# Patient Record
Sex: Male | Born: 1970
Health system: Southern US, Community
[De-identification: ages and names within clinical notes are randomized; demographics above are authoritative.]

## PROBLEM LIST (undated history)

## (undated) DIAGNOSIS — J189 Pneumonia, unspecified organism: Secondary | ICD-10-CM

## (undated) DIAGNOSIS — R011 Cardiac murmur, unspecified: Secondary | ICD-10-CM

## (undated) HISTORY — PX: KNEE ARTHROPLASTY: SHX992

## (undated) HISTORY — PX: KNEE ARTHROSCOPY: SHX127

---

## 2002-07-28 ENCOUNTER — Emergency Department (HOSPITAL_COMMUNITY): Admission: EM | Admit: 2002-07-28 | Discharge: 2002-07-28 | Payer: Self-pay | Admitting: Emergency Medicine

## 2003-09-27 ENCOUNTER — Observation Stay (HOSPITAL_COMMUNITY): Admission: RE | Admit: 2003-09-27 | Discharge: 2003-09-28 | Payer: Self-pay | Admitting: Orthopedic Surgery

## 2015-09-11 ENCOUNTER — Ambulatory Visit: Payer: Self-pay | Admitting: Internal Medicine

## 2016-08-22 DIAGNOSIS — Z0000 Encounter for general adult medical examination without abnormal findings: Secondary | ICD-10-CM | POA: Diagnosis not present

## 2016-08-22 DIAGNOSIS — Z125 Encounter for screening for malignant neoplasm of prostate: Secondary | ICD-10-CM | POA: Diagnosis not present

## 2016-08-29 DIAGNOSIS — N529 Male erectile dysfunction, unspecified: Secondary | ICD-10-CM | POA: Diagnosis not present

## 2016-08-29 DIAGNOSIS — I10 Essential (primary) hypertension: Secondary | ICD-10-CM | POA: Diagnosis not present

## 2016-08-29 DIAGNOSIS — R011 Cardiac murmur, unspecified: Secondary | ICD-10-CM | POA: Diagnosis not present

## 2016-08-29 DIAGNOSIS — R5383 Other fatigue: Secondary | ICD-10-CM | POA: Diagnosis not present

## 2016-08-29 DIAGNOSIS — Z0000 Encounter for general adult medical examination without abnormal findings: Secondary | ICD-10-CM | POA: Diagnosis not present

## 2016-10-16 DIAGNOSIS — N5201 Erectile dysfunction due to arterial insufficiency: Secondary | ICD-10-CM | POA: Diagnosis not present

## 2016-10-16 DIAGNOSIS — R3121 Asymptomatic microscopic hematuria: Secondary | ICD-10-CM | POA: Diagnosis not present

## 2017-02-14 DIAGNOSIS — S29011A Strain of muscle and tendon of front wall of thorax, initial encounter: Secondary | ICD-10-CM | POA: Diagnosis not present

## 2017-05-10 ENCOUNTER — Other Ambulatory Visit: Payer: Self-pay

## 2017-05-10 ENCOUNTER — Emergency Department (HOSPITAL_BASED_OUTPATIENT_CLINIC_OR_DEPARTMENT_OTHER)
Admission: EM | Admit: 2017-05-10 | Discharge: 2017-05-10 | Disposition: A | Discharge status: Home / Self Care | Payer: BLUE CROSS/BLUE SHIELD | Attending: Emergency Medicine | Admitting: Emergency Medicine

## 2017-05-10 ENCOUNTER — Encounter (HOSPITAL_BASED_OUTPATIENT_CLINIC_OR_DEPARTMENT_OTHER): Payer: Self-pay

## 2017-05-10 ENCOUNTER — Emergency Department (HOSPITAL_BASED_OUTPATIENT_CLINIC_OR_DEPARTMENT_OTHER): Payer: BLUE CROSS/BLUE SHIELD

## 2017-05-10 DIAGNOSIS — J189 Pneumonia, unspecified organism: Secondary | ICD-10-CM

## 2017-05-10 DIAGNOSIS — R0602 Shortness of breath: Secondary | ICD-10-CM | POA: Diagnosis not present

## 2017-05-10 DIAGNOSIS — R0789 Other chest pain: Secondary | ICD-10-CM | POA: Insufficient documentation

## 2017-05-10 DIAGNOSIS — R05 Cough: Secondary | ICD-10-CM | POA: Diagnosis not present

## 2017-05-10 DIAGNOSIS — J181 Lobar pneumonia, unspecified organism: Secondary | ICD-10-CM | POA: Insufficient documentation

## 2017-05-10 MED ORDER — ALBUTEROL SULFATE HFA 108 (90 BASE) MCG/ACT IN AERS
2.0000 | INHALATION_SPRAY | RESPIRATORY_TRACT | Status: DC | PRN
  Administered 2017-05-10: 2 via RESPIRATORY_TRACT
  Filled 2017-05-10: qty 6.7

## 2017-05-10 MED ORDER — DOXYCYCLINE HYCLATE 100 MG PO TABS
100.0000 mg | ORAL_TABLET | Freq: Once | ORAL | Status: AC
  Administered 2017-05-10: 100 mg via ORAL
  Filled 2017-05-10: qty 1

## 2017-05-10 MED ORDER — DOXYCYCLINE HYCLATE 100 MG PO CAPS
100.0000 mg | ORAL_CAPSULE | Freq: Two times a day (BID) | ORAL | 0 refills | Status: DC
Start: 2017-05-10 — End: 2017-07-22

## 2017-05-10 NOTE — ED Notes (Signed)
ED Provider at bedside. 

## 2017-05-10 NOTE — ED Provider Notes (Addendum)
[facility] Provider Note: Greg DellJ. Lane Garris Melhorn, MD, FACEP  CSN: [id number] MRN: [id number] ARRIVAL: 05/10/17 at 0007 ROOM: [facility]03/[facility]03   CHIEF COMPLAINT  Cough   HISTORY OF PRESENT ILLNESS  05/10/17 4:57 AM Greg Boone is a 46 y.o. male with a 2-1/2-week history of respiratory symptoms.  Specifically he has had a dry cough and lung congestion with occasional shortness of breath.  He has been taking over-the-counter cough and cold medications with some relief.  His symptoms are worse when at work; he works in a tobacco factory.  His symptoms worsened acutely yesterday evening when he was at a ball game.  He became more short of breath than usual and he developed mild to moderate pain in his right mid chest and epigastrium, pleuritic in nature.  He is not aware of having a fever but was noted to have a temperature of 99.5 degrees on arrival.  He has had night sweats.   History reviewed. No pertinent past medical history.  History reviewed. No pertinent surgical history.  No family history on file.  Social History   Tobacco Use  . Smoking status: Never Smoker  . Smokeless tobacco: Never Used  Substance Use Topics  . Alcohol use: Yes    Frequency: Never  . Drug use: No    Prior to Admission medications   Not on File    Allergies Patient has no known allergies.   REVIEW OF SYSTEMS  Negative except as noted here or in the History of Present Illness.   PHYSICAL EXAMINATION  Initial Vital Signs Blood pressure 127/65, pulse 70, temperature 99.5 F (37.5 C), temperature source Oral, resp. rate 18, height 6' (1.829 m), weight 74.8 kg (165 lb), SpO2 98 %.  Examination General: Well-developed, well-nourished male in no acute distress; appearance consistent with age of record HENT: normocephalic; atraumatic Eyes: pupils equal, round and reactive to light; extraocular muscles intact Neck: supple Heart: regular rate and rhythm Lungs: Decreased sounds right upper  lobe Abdomen: soft; nondistended; nontender; bowel sounds present Extremities: No deformity; full range of motion; pulses normal Neurologic: Awake, alert and oriented; motor function intact in all extremities and symmetric; no facial droop Skin: Warm and dry Psychiatric: Normal mood and affect   RESULTS  Summary of this visit's results, reviewed by myself:   EKG Interpretation  Date/Time:    Ventricular Rate:    PR Interval:    QRS Duration:   QT Interval:    QTC Calculation:   R Axis:     Text Interpretation:        Laboratory Studies: No results found for this or any previous visit (from the past 24 hour(s)). Imaging Studies: Dg Chest 2 View  Result Date: 05/10/2017 CLINICAL DATA:  Cough and congestion for 2 weeks.  Fever. EXAM: CHEST  2 VIEW COMPARISON:  None. FINDINGS: Small to moderate size consolidation in the right middle lobe. Minimal left basilar scarring. Normal heart size and mediastinal contours. No pulmonary edema, pleural fluid or pneumothorax. No acute osseous abnormalities are seen. IMPRESSION: Right middle lobe consolidation concerning for pneumonia in the setting of cough and fever. Followup PA and lateral chest X-ray is recommended in 3-4 weeks following trial of antibiotic therapy to ensure resolution and exclude underlying malignancy. Electronically Signed   By: Rubye OaksMelanie  Ehinger M.D.   On: 05/10/2017 04:25    ED COURSE  Nursing notes and initial vitals signs, including pulse oximetry, reviewed.  Vitals:   05/10/17 0028 05/10/17 [id number] 05/10/17 [id number]  BP: 127/65    Pulse: 70    Resp: 18  17  Temp: 99.5 F (37.5 C)  98.6 F (37 C)  TempSrc: Oral  Oral  SpO2: 98%  97%  Weight:  74.8 kg (165 lb)   Height:  6' (1.829 m)    Patient advised to follow-up with his PCP in several weeks to ensure resolution of x-ray abnormalities.  PROCEDURES    ED DIAGNOSES     ICD-10-CM   1. Community acquired pneumonia of right middle lobe of lung (HCC) J18.1         Aleese Kamps, MD 05/10/17 [id number]    Paula LibraMolpus, Justin Meisenheimer, MD 05/10/17 [phone]

## 2017-05-10 NOTE — ED Triage Notes (Signed)
Pt c/o cough and congestion x2 1/2 wks;

## 2017-05-26 DIAGNOSIS — J181 Lobar pneumonia, unspecified organism: Secondary | ICD-10-CM | POA: Diagnosis not present

## 2017-06-06 DIAGNOSIS — J181 Lobar pneumonia, unspecified organism: Secondary | ICD-10-CM | POA: Diagnosis not present

## 2017-06-25 DIAGNOSIS — J181 Lobar pneumonia, unspecified organism: Secondary | ICD-10-CM | POA: Diagnosis not present

## 2017-06-25 DIAGNOSIS — J189 Pneumonia, unspecified organism: Secondary | ICD-10-CM | POA: Diagnosis not present

## 2017-06-26 ENCOUNTER — Other Ambulatory Visit: Payer: Self-pay | Admitting: Internal Medicine

## 2017-06-26 DIAGNOSIS — J690 Pneumonitis due to inhalation of food and vomit: Secondary | ICD-10-CM

## 2017-06-27 ENCOUNTER — Other Ambulatory Visit: Payer: Self-pay | Admitting: Internal Medicine

## 2017-06-27 DIAGNOSIS — R9389 Abnormal findings on diagnostic imaging of other specified body structures: Secondary | ICD-10-CM

## 2017-06-27 DIAGNOSIS — J984 Other disorders of lung: Secondary | ICD-10-CM | POA: Diagnosis not present

## 2017-06-27 DIAGNOSIS — J690 Pneumonitis due to inhalation of food and vomit: Secondary | ICD-10-CM

## 2017-07-02 ENCOUNTER — Other Ambulatory Visit: Payer: BLUE CROSS/BLUE SHIELD

## 2017-07-02 ENCOUNTER — Ambulatory Visit
Admission: RE | Admit: 2017-07-02 | Discharge: 2017-07-02 | Disposition: A | Discharge status: Home / Self Care | Payer: BLUE CROSS/BLUE SHIELD | Source: Ambulatory Visit | Attending: Internal Medicine | Admitting: Internal Medicine

## 2017-07-02 DIAGNOSIS — J984 Other disorders of lung: Secondary | ICD-10-CM

## 2017-07-02 DIAGNOSIS — R9389 Abnormal findings on diagnostic imaging of other specified body structures: Secondary | ICD-10-CM

## 2017-07-02 DIAGNOSIS — J690 Pneumonitis due to inhalation of food and vomit: Secondary | ICD-10-CM

## 2017-07-02 DIAGNOSIS — J9811 Atelectasis: Secondary | ICD-10-CM | POA: Diagnosis not present

## 2017-07-02 MED ORDER — IOPAMIDOL (ISOVUE-300) INJECTION 61%
75.0000 mL | Freq: Once | INTRAVENOUS | Status: AC | PRN
Start: 2017-07-02 — End: 2017-07-02
  Administered 2017-07-02: 75 mL via INTRAVENOUS

## 2017-07-22 ENCOUNTER — Encounter: Payer: Self-pay | Admitting: Internal Medicine

## 2017-07-22 ENCOUNTER — Ambulatory Visit (INDEPENDENT_AMBULATORY_CARE_PROVIDER_SITE_OTHER): Payer: BLUE CROSS/BLUE SHIELD | Admitting: Internal Medicine

## 2017-07-22 VITALS — BP 130/80 | HR 58 | Ht 72.0 in | Wt 170.2 lb

## 2017-07-22 DIAGNOSIS — R058 Other specified cough: Secondary | ICD-10-CM

## 2017-07-22 DIAGNOSIS — J181 Lobar pneumonia, unspecified organism: Secondary | ICD-10-CM | POA: Diagnosis not present

## 2017-07-22 DIAGNOSIS — J189 Pneumonia, unspecified organism: Secondary | ICD-10-CM

## 2017-07-22 DIAGNOSIS — R05 Cough: Secondary | ICD-10-CM | POA: Diagnosis not present

## 2017-07-22 DIAGNOSIS — J9819 Other pulmonary collapse: Secondary | ICD-10-CM | POA: Diagnosis not present

## 2017-07-22 NOTE — Patient Instructions (Addendum)
ICD-10-CM   1. Pneumonia of right middle lobe due to infectious organism (HCC) J18.1   2. 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 Nocturnal cough R05     Take OTC prilosec 20mg  daily on empty stomach and see if night cough resolves Do followup CT chest without contrast mid to  end of may 2019   Followup - return to see me after CT chest in may 2019 - return earlier if needed especially if symptomatic

## 2017-07-22 NOTE — Progress Notes (Signed)
Subjective:    Patient ID: Greg Boone, male    DOB: 07/18/1970, 47 y.o.   MRN: [id number]  PCP System, Pcp Not In  HPI  IOV 07/22/2017  Chief Complaint  Patient presents with  . Advice Only    Referred by Dr. Renne CriglerPharr due to pulm consult for rt middle lobe.  Pt was told he had pna December 2018. Pt denies any current complaints of cough, SOB, or CP.   47 year old male without any medical problems.  He reports that in the last 2 weeks of November 2018 he started having some cough and shortness of breath.  At that point in time he was taking his son to a lot of football games in the open field with a lot of rain.  On May 10, 2017 he ended up in the ER because of symptoms.  Chest x-ray on that particular day that I personally visualized showed significant right middle lobe consolidation.  He was discharged with albuterol inhaler as needed and also 1 week of doxycycline.  After that he bounced back to normal.  Only symptom right now is that his girlfriend reports that he occasionally has nocturnal cough that he is unaware of.  It is mild.  It is not progressive.  Overall he is back to baseline and is fully functional.  He would not know that he has a health issue.  There is no fever or chills or hemoptysis orthopnea or paroxysmal nocturnal dyspnea or hemoptysis or weight loss or anorexia.  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  He did have a follow-up chest x-ray in January with primary care physician and most recently a CT scan of the chest July 02, 2017 that shows right middle lobe atelectasis scarring pattern.     has no past medical history on file.   reports that  has never smoked. he has never used smokeless tobacco.  No past surgical history on file.  No Known Allergies  Immunization History  Administered Date(s) Administered  . Influenza Split 03/24/2017     No family history on file.  No current outpatient medications on file.    Review of Systems  Constitutional: Negative for fever and unexpected weight change.  HENT: Negative for congestion, dental problem, ear pain, nosebleeds, postnasal drip, rhinorrhea, sinus pressure, sneezing, sore throat and trouble swallowing.   Eyes: Negative for redness and itching.  Respiratory: Negative for cough, chest tightness, shortness of breath and wheezing.   Cardiovascular: Negative for palpitations and leg swelling.  Gastrointestinal: Negative for nausea and vomiting.  Genitourinary: Negative for dysuria.  Musculoskeletal: Negative for joint swelling.  Skin: Negative for rash.  Allergic/Immunologic: Negative.  Negative for environmental allergies, food allergies and immunocompromised state.  Neurological: Negative for headaches.  Hematological: Does not bruise/bleed easily.  Psychiatric/Behavioral: Negative for dysphoric mood. The patient is not nervous/anxious.        Objective:   Physical Exam  Constitutional: He is oriented to person, place, and time. He appears well-developed and well-nourished. No distress.  HENT:  Head: Normocephalic and atraumatic.  Right Ear: External ear normal.  Left Ear: External ear normal.  Mouth/Throat: Oropharynx is clear and moist. No oropharyngeal exudate.  Eyes: Conjunctivae and EOM are normal. Pupils are equal, round, and reactive to light. Right eye exhibits no discharge. Left eye exhibits no discharge. No scleral icterus.  Neck: Normal range of motion. Neck supple. No JVD present. No tracheal deviation present. No thyromegaly present.  Cardiovascular: Normal rate, regular rhythm and intact distal pulses. Exam reveals no gallop and no friction rub.  No murmur heard. Pulmonary/Chest: Effort normal and breath sounds normal. No respiratory distress. He has no wheezes. He has no rales. He exhibits no tenderness.  Abdominal: Soft. Bowel sounds are normal. He  exhibits no distension and no mass. There is no tenderness. There is no rebound and no guarding.  Musculoskeletal: Normal range of motion. He exhibits no edema or tenderness.  Lymphadenopathy:    He has no cervical adenopathy.  Neurological: He is alert and oriented to person, place, and time. He has normal reflexes. No cranial nerve deficit. Coordination normal.  Skin: Skin is warm and dry. No rash noted. He is not diaphoretic. No erythema. No pallor.  Psychiatric: He has a normal mood and affect. His behavior is normal. Judgment and thought content normal.  Nursing note and vitals reviewed.   Vitals:   07/22/17 0937  BP: 130/80  Pulse: (!) 58  SpO2: 99%  Weight: 170 lb 3.2 oz (77.2 kg)  Height: 6' (1.829 m)    Estimated body mass index is 23.08 kg/m as calculated from the following:   Height as of this encounter: 6' (1.829 m).   Weight as of this encounter: 170 lb 3.2 oz (77.2 kg).       Assessment & Plan:     ICD-10-CM   1. Pneumonia of right middle lobe due to infectious organism (HCC) J18.1   2. 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 Nocturnal cough R05    Clinically resolving pneumonia versus developing right middle lobe syndrome.  Currently asymptomatic.  Nocturnal cough is probably reflective of acid reflux but it could also be the residual atelectasis.  At this point in time because he is feeling well expectant approach with radiologic follow-up is the best approach.  He is agreeable with the plan and therefore   Take OTC prilosec 20mg  daily on empty stomach and see if night cough resolves Do followup CT chest without contrast mid to  end of may 2019   Followup - return to see me after CT chest in may 2019 - return earlier if needed especially if symptomatic    Dr. Kalman Shan, M.D., [facility].C.P Pulmonary and Critical Care Medicine Staff Physician, [facility] Center Director - Interstitial Lung Disease  Program  [facility] at [facility] [place], [place], [address]  Pager: [phone], If no answer or between  15:00h - 7:00h: call [phone] Telephone: [phone]

## 2017-07-31 ENCOUNTER — Institutional Professional Consult (permissible substitution): Payer: BLUE CROSS/BLUE SHIELD | Admitting: Internal Medicine

## 2017-08-25 DIAGNOSIS — N390 Urinary tract infection, site not specified: Secondary | ICD-10-CM | POA: Diagnosis not present

## 2017-08-25 DIAGNOSIS — I10 Essential (primary) hypertension: Secondary | ICD-10-CM | POA: Diagnosis not present

## 2017-08-25 DIAGNOSIS — Z0000 Encounter for general adult medical examination without abnormal findings: Secondary | ICD-10-CM | POA: Diagnosis not present

## 2017-08-25 DIAGNOSIS — R319 Hematuria, unspecified: Secondary | ICD-10-CM | POA: Diagnosis not present

## 2017-09-01 DIAGNOSIS — I10 Essential (primary) hypertension: Secondary | ICD-10-CM | POA: Diagnosis not present

## 2017-09-01 DIAGNOSIS — N529 Male erectile dysfunction, unspecified: Secondary | ICD-10-CM | POA: Diagnosis not present

## 2017-09-01 DIAGNOSIS — R011 Cardiac murmur, unspecified: Secondary | ICD-10-CM | POA: Diagnosis not present

## 2017-09-01 DIAGNOSIS — Z0000 Encounter for general adult medical examination without abnormal findings: Secondary | ICD-10-CM | POA: Diagnosis not present

## 2017-10-15 ENCOUNTER — Ambulatory Visit (INDEPENDENT_AMBULATORY_CARE_PROVIDER_SITE_OTHER)
Admission: RE | Admit: 2017-10-15 | Discharge: 2017-10-15 | Disposition: A | Discharge status: Home / Self Care | Payer: BLUE CROSS/BLUE SHIELD | Source: Ambulatory Visit | Attending: Internal Medicine | Admitting: Internal Medicine

## 2017-10-15 DIAGNOSIS — J181 Lobar pneumonia, unspecified organism: Secondary | ICD-10-CM | POA: Diagnosis not present

## 2017-10-15 DIAGNOSIS — J189 Pneumonia, unspecified organism: Secondary | ICD-10-CM

## 2017-10-16 ENCOUNTER — Ambulatory Visit (INDEPENDENT_AMBULATORY_CARE_PROVIDER_SITE_OTHER): Payer: BLUE CROSS/BLUE SHIELD | Admitting: Internal Medicine

## 2017-10-16 ENCOUNTER — Encounter: Payer: Self-pay | Admitting: Internal Medicine

## 2017-10-16 VITALS — BP 132/74 | HR 50 | Ht 72.0 in | Wt 169.2 lb

## 2017-10-16 DIAGNOSIS — J181 Lobar pneumonia, unspecified organism: Secondary | ICD-10-CM

## 2017-10-16 DIAGNOSIS — J189 Pneumonia, unspecified organism: Secondary | ICD-10-CM

## 2017-10-16 NOTE — Patient Instructions (Addendum)
ICD-10-CM   1. Pneumonia of right middle lobe due to infectious organism [facility]) J18.1     On CT 10/15/17 this looks like persistent scar with some lung tissue local destructiin (called mild bronchiectasis)  Given fact your asymptomatic we can watch this  Followup  CXR 2 view in 3 months - t his is CXR only visit Return in 6 months or sooenr if needed - if symptomatic at any time return sooner

## 2017-10-16 NOTE — Progress Notes (Signed)
Subjective:     Patient ID: Greg Boone, male   DOB: Feb 19, 1971, 47 y.o.   MRN: [id number]  HPI   PCP System, Pcp Not In  HPI  IOV 07/22/2017  Chief Complaint  Patient presents with  . Advice Only    Referred by Dr. Renne Crigler due to pulm consult for rt middle lobe.  Pt was told he had pna December 2018. Pt denies any current complaints of cough, SOB, or CP.   47 year old male without any medical problems.  He reports that in the last 2 weeks of November 2018 he started having some cough and shortness of breath.  At that point in time he was taking his son to a lot of football games in the open field with a lot of rain.  On May 10, 2017 he ended up in the ER because of symptoms.  Chest x-ray on that particular day that I personally visualized showed significant right middle lobe consolidation.  He was discharged with albuterol inhaler as needed and also 1 week of doxycycline.  After that he bounced back to normal.  Only symptom right now is that his girlfriend reports that he occasionally has nocturnal cough that he is unaware of.  It is mild.  It is not progressive.  Overall he is back to baseline and is fully functional.  He would not know that he has a health issue.  There is no fever or chills or hemoptysis orthopnea or paroxysmal nocturnal dyspnea or hemoptysis or weight loss or anorexia.  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  He did have a follow-up chest x-ray in January with primary care physician and most recently a CT scan of the chest July 02, 2017 that shows right middle lobe atelectasis scarring pattern.   OV 10/16/2017  Chief Complaint  Patient presents with  . Follow-up    HRCT done 3/27. Pt states he has been doing good since last visit and denies any complaints or concerns.     Greg Boone returns for follow-up of his right middle lobe posterior  pneumonia focal scar with bronchiectasis local  In the interim he continues to be asymptomatic.  He had CT scan of the chest yesterday that I personally visualized and it shows persistent scar with focal bronchiectasis.  He denies any shortness of breath or cough or wheezing or hemoptysis or sputum production orthopnea wheezing or weight loss or night sweats or chills.  Ct Chest Wo Contrast  Result Date: 10/15/2017 CLINICAL DATA:  47 year old male with history of right middle lobe pneumonia. Follow-up study. EXAM: CT CHEST WITHOUT CONTRAST TECHNIQUE: Multidetector CT imaging of the chest was performed following the standard protocol without IV contrast. COMPARISON:  Chest CT 07/02/2017. FINDINGS: Cardiovascular: Heart size is normal. There is no significant pericardial fluid, thickening or pericardial calcification. No significant atherosclerotic calcifications noted in the thoracic aorta or the coronary arteries. Mediastinum/Nodes: No pathologically enlarged mediastinal or hilar lymph nodes. Please note that accurate exclusion of hilar adenopathy is limited on noncontrast CT scans. Esophagus is unremarkable in appearance. No axillary lymphadenopathy. Lungs/Pleura: Chronic scarring and volume loss in the right middle lobe predominantly involving the lateral segment. Mild scarring also noted in the left lower lobe at the lung base. No acute consolidative airspace disease. No pleural effusions. No suspicious appearing pulmonary nodules or masses. Upper Abdomen: 7 mm  low-attenuation lesion in segment 8 of the liver, incompletely characterized on today's noncontrast CT examination, but similar to the prior study, likely a tiny cyst. Musculoskeletal: There are no aggressive appearing lytic or blastic lesions noted in the visualized portions of the skeleton. IMPRESSION: 1. Chronic scarring in the lateral segment of the right middle lobe is unchanged compared to the prior study, presumably related to remote right  middle lobe pneumonia. 2. Additional incidental findings, as above. Electronically Signed   By: Trudie Reed M.D.   On: 10/15/2017 13:35        has no past medical history on file.   reports that he has never smoked. He has never used smokeless tobacco.  No past surgical history on file.  No Known Allergies  Immunization History  Administered Date(s) Administered  . Influenza Split 03/24/2017    No family history on file.  No current outpatient medications on file.   Review of Systems     Objective:   Physical Exam Vitals:   10/16/17 0934  BP: 132/74  Pulse: (!) 50  SpO2: 100%  Weight: 169 lb 3.2 oz (76.7 kg)  Height: 6' (1.829 m)   Discussion only visit    Assessment:       ICD-10-CM   1. Pneumonia of right middle lobe due to infectious organism [facility]) J18.1 DG Chest 2 View       Plan:      On CT 10/15/17 this looks like persistent scar with some lung tissue local destructiin (called mild bronchiectasis)  Given fact your asymptomatic we can watch this  Followup  CXR 2 view in 3 months - t his is CXR only visit Return in 6 months or sooenr if needed - if symptomatic at any time return sooner  (> 50% of this 15 min visit spent in face to face counseling or/and coordination of care)   Dr. Kalman Shan, M.D., [facility].C.P Pulmonary and Critical Care Medicine Staff Physician, [facility] Center Director - Interstitial Lung Disease  Program  Pulmonary Fibrosis [facility] at [facility] [place], [place], [address]  Pager: [phone], If no answer or between  15:00h - 7:00h: call [phone] Telephone: [phone]

## 2017-12-26 DIAGNOSIS — H40013 Open angle with borderline findings, low risk, bilateral: Secondary | ICD-10-CM | POA: Diagnosis not present

## 2018-08-27 DIAGNOSIS — R5383 Other fatigue: Secondary | ICD-10-CM | POA: Diagnosis not present

## 2018-08-27 DIAGNOSIS — Z0000 Encounter for general adult medical examination without abnormal findings: Secondary | ICD-10-CM | POA: Diagnosis not present

## 2018-08-27 DIAGNOSIS — I10 Essential (primary) hypertension: Secondary | ICD-10-CM | POA: Diagnosis not present

## 2018-08-27 DIAGNOSIS — E559 Vitamin D deficiency, unspecified: Secondary | ICD-10-CM | POA: Diagnosis not present

## 2018-08-27 DIAGNOSIS — Z125 Encounter for screening for malignant neoplasm of prostate: Secondary | ICD-10-CM | POA: Diagnosis not present

## 2018-09-03 DIAGNOSIS — Z0000 Encounter for general adult medical examination without abnormal findings: Secondary | ICD-10-CM | POA: Diagnosis not present

## 2018-11-29 IMAGING — CT CT CHEST W/ CM
1 of 2 series · 14 of 30 positions shown, 18 images · IV contrast (APPLIED)
Comparison: Chest radiograph from 05/10/2017.

CLINICAL DATA: Followup abnormal chest radiograph. Evaluate for
scarring.

EXAM:
CT CHEST WITH CONTRAST
TECHNIQUE: Multidetector CT imaging of the chest was performed during
intravenous contrast administration.
CONTRAST:  75mL [id number] IOPAMIDOL (0GBRHF-6VV) INJECTION 61%

[Series 2: chest w/cm · axial · 0.66mm/px · z∈[+930,+1264]mm · 14 of 195 slices shown, 18 images]
[im 14/195  mediastinal]
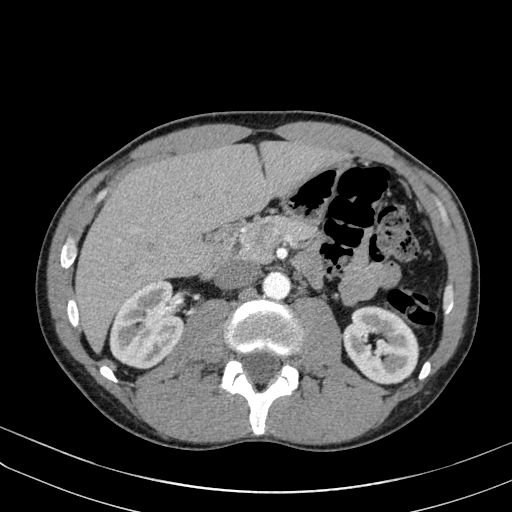
[im 14/195  lung]
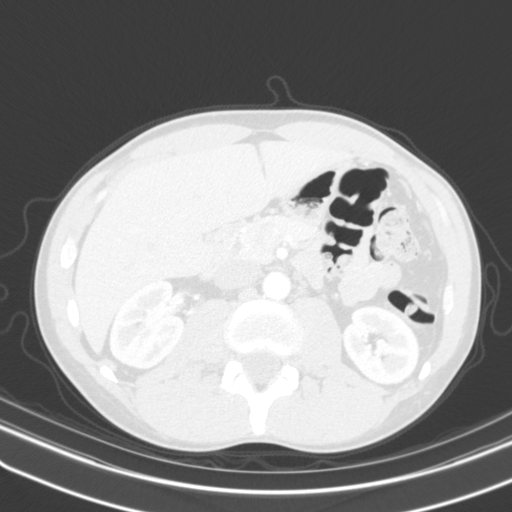
[im 28/195  lung]
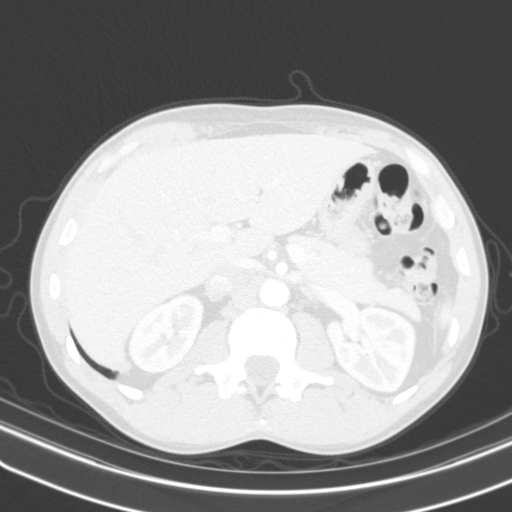
[im 42/195  lung]
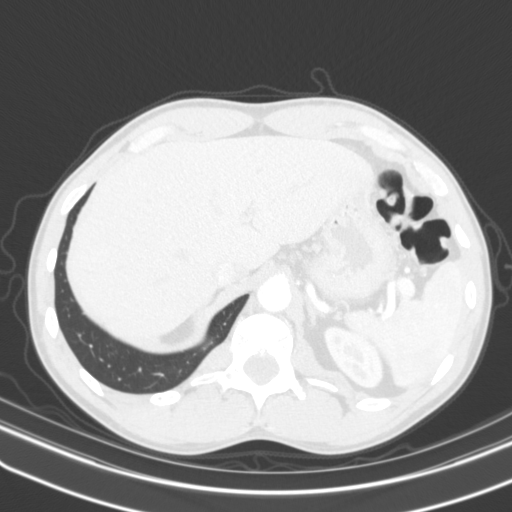
[im 56/195  lung]
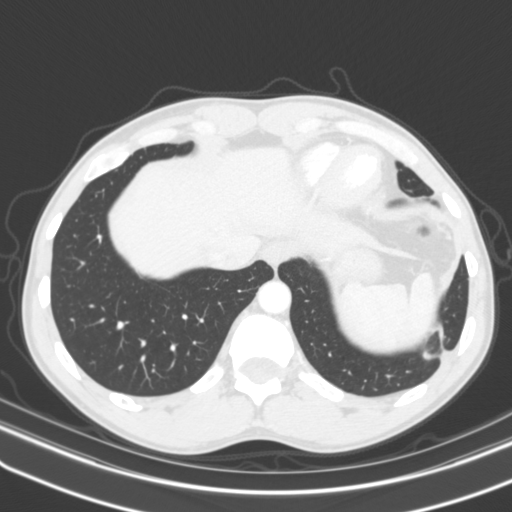
[im 70/195  mediastinal]
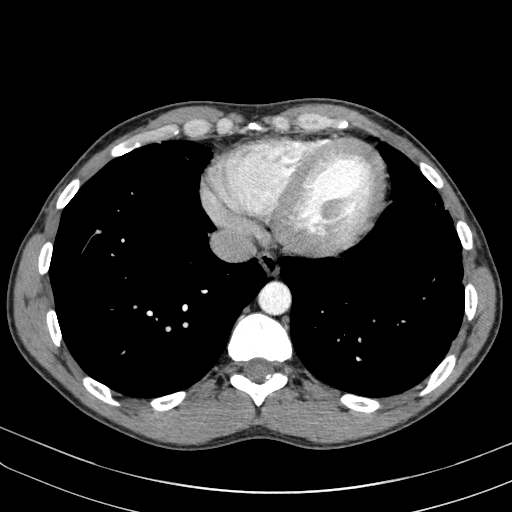
[im 70/195  lung]
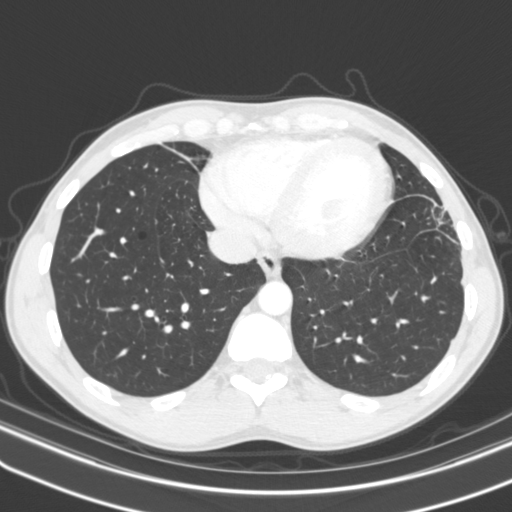
[im 84/195  lung]
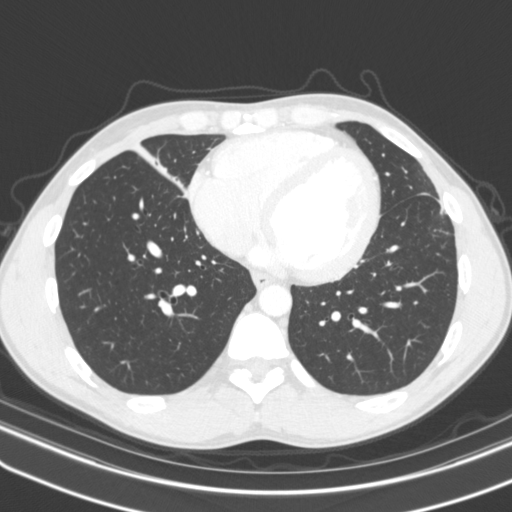
[im 93/195  lung]
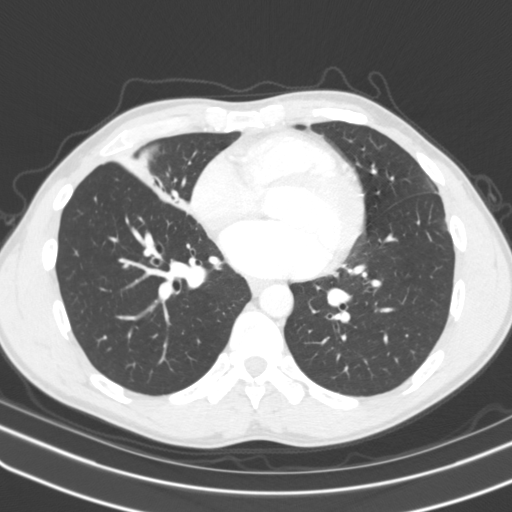
[im 98/195  lung]
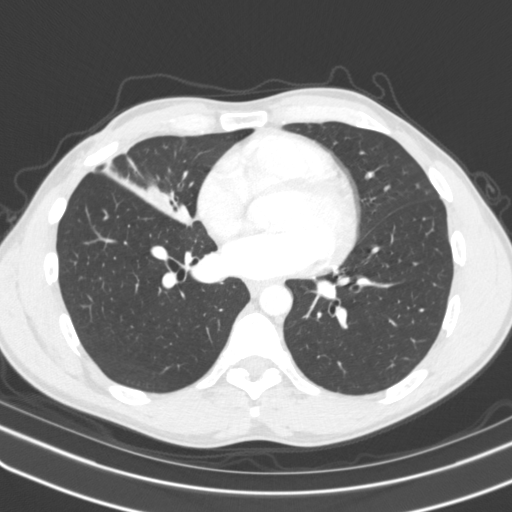
[im 111/195  mediastinal]
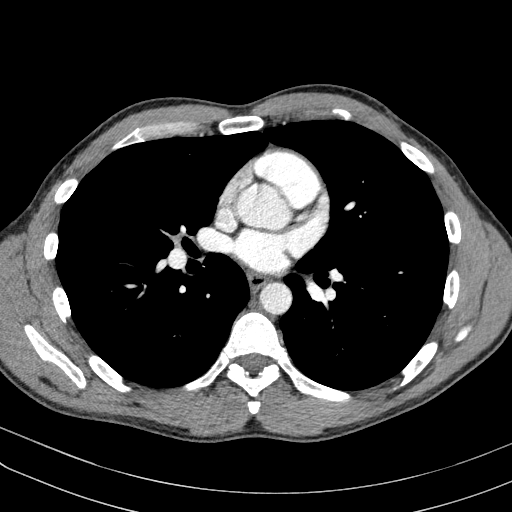
[im 111/195  lung]
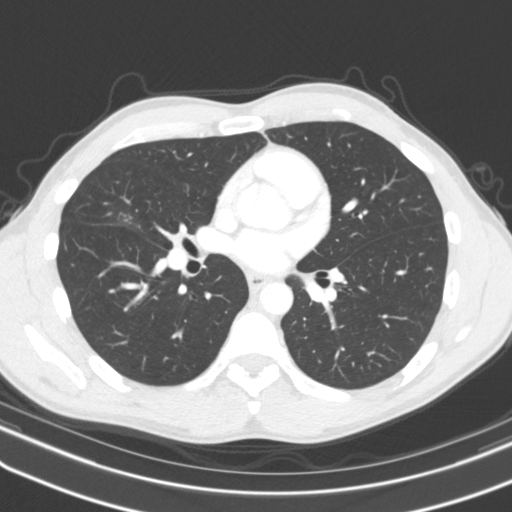
[im 125/195  lung]
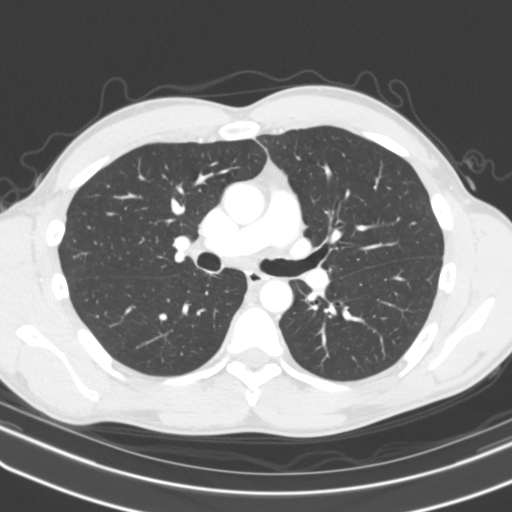
[im 139/195  lung]
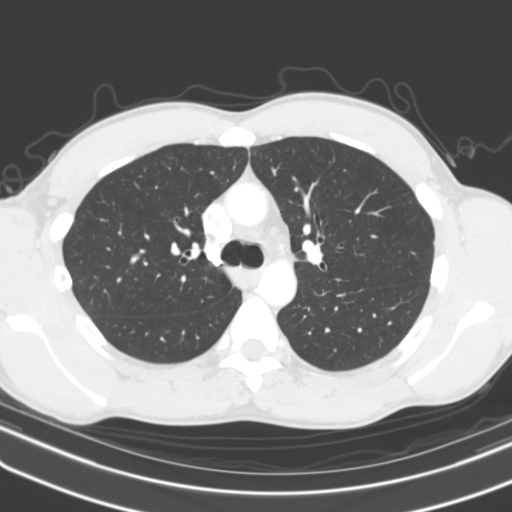
[im 153/195  lung]
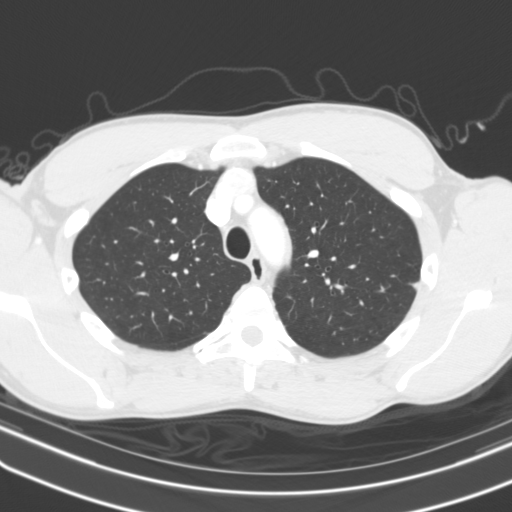
[im 167/195  mediastinal]
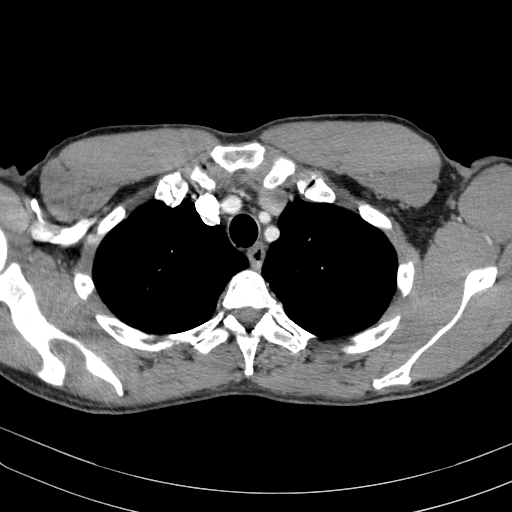
[im 167/195  lung]
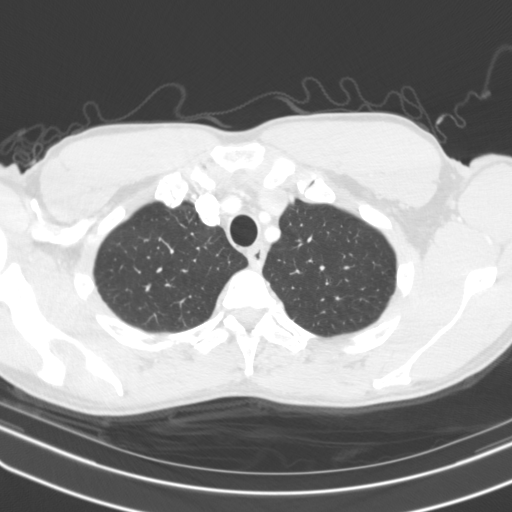
[im 181/195  lung]
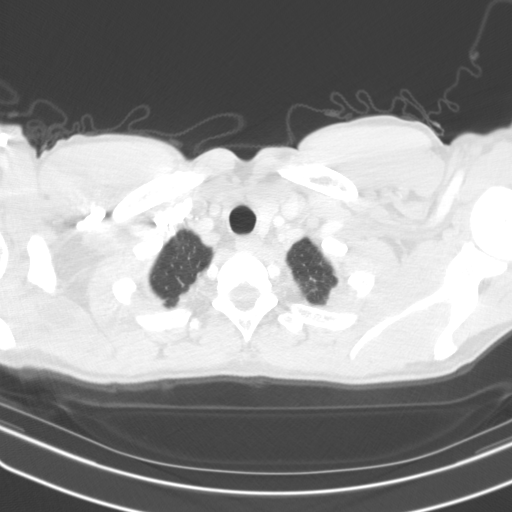

[14 of 30 positions shown; findings below may reference images not displayed]

FINDINGS: Cardiovascular: Normal heart size.  No pericardial effusion.

Mediastinum/Nodes: The trachea appears patent and is midline. Normal
appearance of the esophagus. No supraclavicular or axillary
adenopathy. No enlarged mediastinal or hilar lymph nodes identified.

Lungs/Pleura: No pleural effusion. Scarring, volume loss and
subsegmental atelectasis involving the right middle lobe is
identified, likely the sequelae of previous pneumonia. No
endobronchial lesion identified. No suspicious pulmonary nodules or
masses identified.

Upper Abdomen: No acute abnormality. Tiny cyst noted within the
anterolateral 6 mm. Adjacent low-attenuation structure is too small
to characterize, image number 159 of series 2.

Musculoskeletal: No chest wall abnormality. No acute or significant
osseous findings.
IMPRESSION: 1. Scarring, volume loss and/or subsegmental atelectasis in area of
previous pneumonia noted in the right middle lobe. Suggest followup
PA and lateral chest radiograph in 3 months to ensure stability
and/or resolution.

## 2019-02-22 DIAGNOSIS — H40023 Open angle with borderline findings, high risk, bilateral: Secondary | ICD-10-CM | POA: Diagnosis not present

## 2019-06-09 ENCOUNTER — Ambulatory Visit: Payer: BC Managed Care – PPO | Attending: Internal Medicine

## 2019-06-09 DIAGNOSIS — Z20828 Contact with and (suspected) exposure to other viral communicable diseases: Secondary | ICD-10-CM | POA: Diagnosis not present

## 2019-06-09 DIAGNOSIS — Z20822 Contact with and (suspected) exposure to covid-19: Secondary | ICD-10-CM

## 2019-06-10 LAB — NOVEL CORONAVIRUS, NAA: SARS-CoV-2, NAA: NOT DETECTED

## 2019-08-27 DIAGNOSIS — Z125 Encounter for screening for malignant neoplasm of prostate: Secondary | ICD-10-CM | POA: Diagnosis not present

## 2019-08-27 DIAGNOSIS — I10 Essential (primary) hypertension: Secondary | ICD-10-CM | POA: Diagnosis not present

## 2019-08-27 DIAGNOSIS — Z0000 Encounter for general adult medical examination without abnormal findings: Secondary | ICD-10-CM | POA: Diagnosis not present

## 2019-08-27 DIAGNOSIS — Z13220 Encounter for screening for lipoid disorders: Secondary | ICD-10-CM | POA: Diagnosis not present

## 2019-09-08 DIAGNOSIS — D509 Iron deficiency anemia, unspecified: Secondary | ICD-10-CM | POA: Diagnosis not present

## 2019-09-08 DIAGNOSIS — R011 Cardiac murmur, unspecified: Secondary | ICD-10-CM | POA: Diagnosis not present

## 2019-09-08 DIAGNOSIS — Z0000 Encounter for general adult medical examination without abnormal findings: Secondary | ICD-10-CM | POA: Diagnosis not present

## 2019-09-23 DIAGNOSIS — K829 Disease of gallbladder, unspecified: Secondary | ICD-10-CM | POA: Diagnosis not present

## 2019-09-30 ENCOUNTER — Ambulatory Visit: Payer: Self-pay | Admitting: General Surgery

## 2019-09-30 DIAGNOSIS — K4090 Unilateral inguinal hernia, without obstruction or gangrene, not specified as recurrent: Secondary | ICD-10-CM | POA: Diagnosis not present

## 2019-09-30 NOTE — H&P (Signed)
Greg Boone Documented: 09/30/2019 8:49 AM Location: [facility] Patient #: [id number] DOB: 08/04/1970 Single / Language: [place] / Race: Black or African American Male  History of Present Illness Greg Areola Boone. Daulton Harbaugh Boone; 09/30/2019 9:31 AM) The patient is a 49 year old male who presents with an inguinal hernia. He is referred by Dr. Selena Boone for evaluation of a right inguinal hernia. The patient states that he first noticed it around 1994 after playing basketball. He didn't really bother him with respect to causing any pain or discomfort. He was always able to reduce it himself. He wore a hernia belt for many years. However more recently he is having to reduce it more frequently. Toward the end of the work day it is causing more "irritation" and he is having to reduce it more frequently. He also states that after eating it'll generally pop out and he will feel it. He denies any burning, shooting or stabbing pain. He'll generally have less discomfort after sitting down. He denies any significant BPH symptoms. He denies any tobacco use. He denies any chest pain, chest pressure, source of breath, dyspnea on exertion, TIAs or amaurosis fugax. He denies any prior abdominal surgery.  I reviewed his PCP note from the end of March. He had a mildly borderline hemoglobin of 12 but a normal hematocrit. MCV was 74. T bili was 1.4 but otherwise his liver function test and comprehensive metabolic panel was normal.   Problem List/Past Medical Greg Areola Boone. Andrey Campanile, Boone; 09/30/2019 9:31 AM) RIGHT INGUINAL HERNIA (K40.90)  Allergies [facility], Boone; 09/30/2019 8:50 AM) No Known Drug Allergies [09/30/2019]: Allergies Reconciled  Medication History [facility], Boone; 09/30/2019 8:50 AM) Cialis (5MG  Tablet, Oral) Active. Multi Vitamin (Oral) Active. Vitamin C (Oral) Specific strength unknown - Active. Medications Reconciled  Social History [place], Boone; 09/30/2019  8:49 AM) No drug use  Other Problems 10/02/2019 Boone. Boone, Boone; 09/30/2019 9:31 AM) Heart murmur     Review of Systems 10/02/2019 Boone. Brayln Duque Boone; 09/30/2019 9:29 AM) 10/02/2019 Present- Wears glasses/contact lenses. Not Present- Earache, Hearing Loss, Hoarseness, Nose Bleed, Oral Ulcers, Ringing in the Ears, Seasonal Allergies, Sinus Pain, Sore Throat, Visual Disturbances and Yellow Eyes. Cardiovascular Not Present- Chest Pain, Difficulty Breathing Lying Down, Leg Cramps, Palpitations, Rapid Heart Rate, Shortness of Breath and Swelling of Extremities. Male Genitourinary Not Present- Blood in Urine, Change in Urinary Stream, Frequency, Impotence, Nocturia, Painful Urination, Urgency and Urine Leakage. Musculoskeletal Not Present- Back Pain, Joint Pain, Joint Stiffness, Muscle Pain, Muscle Weakness and Swelling of Extremities. All other systems negative  Vitals Greg Boone; 09/30/2019 8:50 AM) 09/30/2019 8:50 AM Weight: 169.2 lb Height: 72in Body Surface Area: 1.98 Boone Body Mass Index: 22.95 kg/Boone  Temp.: 98.80F(Temporal)  Pulse: 62 (Regular)  P.OX: 99% (Room air) BP: 140/70 (Sitting, Left Arm, Standard)        Physical Exam 10/02/2019 Greg Boone; 09/30/2019 9:29 AM)  General Mental Status-Alert. General Appearance-Consistent with stated age. Hydration-Well hydrated. Voice-Normal.  Head and Neck Head-normocephalic, atraumatic with no lesions or palpable masses. Trachea-midline. Thyroid Gland Characteristics - normal size and consistency.  Eye Eyeball - Bilateral-Extraocular movements intact. Sclera/Conjunctiva - Bilateral-No scleral icterus.  Chest and Lung Exam Chest and lung exam reveals -quiet, even and easy respiratory effort with no use of accessory muscles and on auscultation, normal breath sounds, no adventitious sounds and normal vocal resonance. Inspection Chest Wall - Normal. Back - normal.  Breast - Did not  examine.  Cardiovascular Cardiovascular examination reveals -normal  heart sounds, regular rate and rhythm with no murmurs and normal pedal pulses bilaterally.  Abdomen Inspection Inspection of the abdomen reveals - No Hernias. Skin - Scar - no surgical scars. Palpation/Percussion Palpation and Percussion of the abdomen reveal - Soft, Non Tender, No Rebound tenderness, No Rigidity (guarding) and No hepatosplenomegaly. Auscultation Auscultation of the abdomen reveals - Bowel sounds normal.  Male Genitourinary Note: Both testicles descended. No scrotal testicular masses. No bulge with Valsalva in the left inguinal canal. Obvious small bulge in right inguinal canal. Positive bulge with Valsalva in right inguinal canal. Reducible  Peripheral Vascular Upper Extremity Palpation - Pulses bilaterally normal.  Neurologic Neurologic evaluation reveals -alert and oriented x 3 with no impairment of recent or remote memory. Mental Status-Normal.  Neuropsychiatric The patient's mood and affect are described as -normal. Judgment and Insight-insight is appropriate concerning matters relevant to self.  Musculoskeletal Normal Exam - Left-Upper Extremity Strength Normal and Lower Extremity Strength Normal. Normal Exam - Right-Upper Extremity Strength Normal and Lower Extremity Strength Normal.  Lymphatic Head & Neck  General Head & Neck Lymphatics: Bilateral - Description - Normal. Axillary - Did not examine. Femoral & Inguinal - Did not examine.    Assessment & Plan Greg Hiss Boone. Makensey Rego Boone; 09/30/2019 9:28 AM)  RIGHT INGUINAL HERNIA (K40.90) Impression: He has a long-standing inguinal hernia that is now becoming more symptomatic so therefore I recommended operative repair.  We discussed the etiology of inguinal hernias. We discussed the signs & symptoms of incarceration & strangulation. We discussed non-operative and operative management. We discussed minimally  invasive/laparoscopic approach  The patient has elected to proceed with Laparoscopic repair of right inguinal hernia with mesh   I described the procedure in detail. The patient was given educational material. We discussed the risks and benefits including but not limited to bleeding, infection, chronic inguinal pain, nerve entrapment, hernia recurrence, mesh complications, hematoma formation, urinary retention, injury to the testicle, numbness in the groin, blood clots, injury to the surrounding structures, and anesthesia risk. We also discussed the typical post operative recovery course, including no heavy lifting for 4-6 weeks. I explained that the likelihood of improvement of their symptoms is good.  This is a low complex surgical problem having a low risk of morbidity with surgery  Current Plans Pt Education - Pamphlet Given - Laparoscopic Hernia Repair: discussed with patient and provided information. You are being scheduled for surgery- Our schedulers will call you.  You should hear from our office's scheduling department within 5 working days about the location, date, and time of surgery. We try to make accommodations for patient's preferences in scheduling surgery, but sometimes the OR schedule or the surgeon's schedule prevents [place] from making those accommodations.  If you have not heard from our office [phone]) in 5 working days, call the office and ask for your surgeon's nurse.  If you have other questions about your diagnosis, plan, or surgery, call the office and ask for your surgeon's nurse.  Leighton Ruff. Redmond Pulling, Boone, FACS General, Bariatric, & Minimally Invasive Surgery [facility], [place]

## 2019-11-09 NOTE — Patient Instructions (Addendum)
DUE TO COVID-19 ONLY ONE VISITOR IS ALLOWED TO COME WITH YOU AND STAY IN THE WAITING ROOM ONLY DURING PRE OP AND PROCEDURE DAY OF SURGERY. THE 1 VISITOR MAY VISIT WITH YOU AFTER SURGERY IN YOUR PRIVATE ROOM DURING VISITING HOURS ONLY!  YOU NEED TO HAVE A COVID 19 TEST ON:11/18/19 @ 11:00 am , THIS TEST MUST BE DONE BEFORE SURGERY, COME  [place], [place] , [address].  ([place]) ONCE YOUR COVID TEST IS COMPLETED, PLEASE BEGIN THE QUARANTINE INSTRUCTIONS AS OUTLINED IN YOUR HANDOUT.                Greg Boone   Your procedure is scheduled on: 11/22/19   Report to [facility] Main  Entrance   Report to admitting at: 7:35 AM     Call this number if you have problems the morning of surgery [phone]    Remember: Do not eat solid food :After Midnight. Clear liquid diet from midnight until 6:30 am.    CLEAR LIQUID DIET   Foods Allowed                                                                     Foods Excluded  Coffee and tea, regular and decaf                             liquids that you cannot  Plain Jell-O any favor except red or purple                                           see through such as: Fruit ices (not with fruit pulp)                                     milk, soups, orange juice  Iced Popsicles                                    All solid food Carbonated beverages, regular and diet                                    Cranberry, grape and apple juices Sports drinks like Gatorade Lightly seasoned clear broth or consume(fat free) Sugar, honey syrup  Sample Menu Breakfast                                Lunch                                     Supper Cranberry juice                    Beef broth  Chicken broth Jell-O                                     Grape juice                           Apple juice Coffee or tea                        Jell-O                                      Popsicle                                        Coffee or tea                        Coffee or tea  _____________________________________________________________________  BRUSH YOUR TEETH MORNING OF SURGERY AND RINSE YOUR MOUTH OUT, NO CHEWING GUM CANDY OR MINTS.                                  You may not have any metal on your body including hair pins and              piercings  Do not wear jewelry,lotions, powders or perfumes, deodorant             Men may shave face and neck.   Do not bring valuables to the hospital. Greg Boone IS NOT             RESPONSIBLE   FOR VALUABLES.  Contacts, dentures or bridgework may not be worn into surgery.  Leave suitcase in the car. After surgery it may be brought to your room.     Patients discharged the day of surgery will not be allowed to drive home. IF YOU ARE HAVING SURGERY AND GOING HOME THE SAME DAY, YOU MUST HAVE AN ADULT TO DRIVE YOU HOME AND BE WITH YOU FOR 24 HOURS. YOU MAY GO HOME BY TAXI OR UBER OR ORTHERWISE, BUT AN ADULT MUST ACCOMPANY YOU HOME AND STAY WITH YOU FOR 24 HOURS.  Name and phone number of your driver:  Special Instructions: N/A              Please read over the following fact sheets you were given: _____________________________________________________________________  [facility] - Preparing for Surgery Before surgery, you can play an important role.  Because skin is not sterile, your skin needs to be as free of germs as possible.  You can reduce the number of germs on your skin by washing with CHG (chlorahexidine gluconate) soap before surgery.  CHG is an antiseptic cleaner which kills germs and bonds with the skin to continue killing germs even after washing. Please DO NOT use if you have an allergy to CHG or antibacterial soaps.  If your skin becomes reddened/irritated stop using the CHG and inform your nurse when you arrive at Short Stay. Do not shave (including legs and underarms) for at least 48 hours prior to the first CHG  shower.  You may shave your  face/neck. Please follow these instructions carefully:  1.  Shower with CHG Soap the night before surgery and the  morning of Surgery.  2.  If you choose to wash your hair, wash your hair first as usual with your  normal  shampoo.  3.  After you shampoo, rinse your hair and body thoroughly to remove the  shampoo.                           4.  Use CHG as you would any other liquid soap.  You can apply chg directly  to the skin and wash                       Gently with a scrungie or clean washcloth.  5.  Apply the CHG Soap to your body ONLY FROM THE NECK DOWN.   Do not use on face/ open                           Wound or open sores. Avoid contact with eyes, ears mouth and genitals (private parts).                       Wash face,  Genitals (private parts) with your normal soap.             6.  Wash thoroughly, paying special attention to the area where your surgery  will be performed.  7.  Thoroughly rinse your body with warm water from the neck down.  8.  DO NOT shower/wash with your normal soap after using and rinsing off  the CHG Soap.                9.  Pat yourself dry with a clean towel.            10.  Wear clean pajamas.            11.  Place clean sheets on your bed the night of your first shower and do not  sleep with pets. Day of Surgery : Do not apply any lotions/deodorants the morning of surgery.  Please wear clean clothes to the hospital/surgery center.  FAILURE TO FOLLOW THESE INSTRUCTIONS MAY RESULT IN THE CANCELLATION OF YOUR SURGERY PATIENT SIGNATURE_________________________________  NURSE SIGNATURE__________________________________  ________________________________________________________________________

## 2019-11-10 ENCOUNTER — Other Ambulatory Visit: Payer: Self-pay

## 2019-11-10 ENCOUNTER — Encounter (HOSPITAL_COMMUNITY): Payer: Self-pay

## 2019-11-10 ENCOUNTER — Encounter (HOSPITAL_COMMUNITY)
Admission: RE | Admit: 2019-11-10 | Discharge: 2019-11-10 | Disposition: A | Discharge status: Home / Self Care | Payer: BC Managed Care – PPO | Source: Ambulatory Visit | Attending: General Surgery | Admitting: General Surgery

## 2019-11-10 DIAGNOSIS — Z01818 Encounter for other preprocedural examination: Secondary | ICD-10-CM | POA: Diagnosis not present

## 2019-11-10 HISTORY — DX: Pneumonia, unspecified organism: J18.9

## 2019-11-10 HISTORY — DX: Cardiac murmur, unspecified: R01.1

## 2019-11-10 LAB — CBC WITH DIFFERENTIAL/PLATELET
Abs Immature Granulocytes: 0.01 10*3/uL (ref 0.00–0.07)
Basophils Absolute: 0 10*3/uL (ref 0.0–0.1)
Basophils Relative: 1 %
Eosinophils Absolute: 0 10*3/uL (ref 0.0–0.5)
Eosinophils Relative: 0 %
HCT: 41.9 % (ref 39.0–52.0)
Hemoglobin: 12.6 g/dL — ABNORMAL LOW (ref 13.0–17.0)
Immature Granulocytes: 0 %
Lymphocytes Relative: 39 %
Lymphs Abs: 2 10*3/uL (ref 0.7–4.0)
MCH: 23.9 pg — ABNORMAL LOW (ref 26.0–34.0)
MCHC: 30.1 g/dL (ref 30.0–36.0)
MCV: 79.5 fL — ABNORMAL LOW (ref 80.0–100.0)
Monocytes Absolute: 0.4 10*3/uL (ref 0.1–1.0)
Monocytes Relative: 8 %
Neutro Abs: 2.7 10*3/uL (ref 1.7–7.7)
Neutrophils Relative %: 52 %
Platelets: 283 10*3/uL (ref 150–400)
RBC: 5.27 MIL/uL (ref 4.22–5.81)
RDW: 13.3 % (ref 11.5–15.5)
WBC: 5.2 10*3/uL (ref 4.0–10.5)
nRBC: 0 % (ref 0.0–0.2)

## 2019-11-10 LAB — COMPREHENSIVE METABOLIC PANEL
ALT: 25 U/L (ref 0–44)
AST: 24 U/L (ref 15–41)
Albumin: 4.1 g/dL (ref 3.5–5.0)
Alkaline Phosphatase: 49 U/L (ref 38–126)
Anion gap: 9 (ref 5–15)
BUN: 12 mg/dL (ref 6–20)
CO2: 29 mmol/L (ref 22–32)
Calcium: 9.3 mg/dL (ref 8.9–10.3)
Chloride: 102 mmol/L (ref 98–111)
Creatinine, Ser: 1.04 mg/dL (ref 0.61–1.24)
GFR calc Af Amer: >60 mL/min (ref >60)
GFR calc non Af Amer: >60 mL/min (ref >60)
Glucose, Bld: 80 mg/dL (ref 70–99)
Potassium: 4.3 mmol/L (ref 3.5–5.1)
Sodium: 140 mmol/L (ref 135–145)
Total Bilirubin: 1.3 mg/dL — ABNORMAL HIGH (ref 0.3–1.2)
Total Protein: 7.1 g/dL (ref 6.5–8.1)

## 2019-11-10 NOTE — Progress Notes (Signed)
COVID Vaccine Completed: yes Date COVID Vaccine completed:3/21 COVID vaccine manufacturer: Pfizer    Moderna   *[employer]   PCP - [facility] :Dr. Brooke Dare. LOV: 08/2019 as per pt. Cardiologist -   Chest x-ray -  EKG -  Stress Test -  ECHO -  Cardiac Cath -   Sleep Study -  CPAP -   Fasting Blood Sugar -  Checks Blood Sugar _____ times a day  Blood Thinner Instructions: Aspirin Instructions: Last Dose:  Anesthesia review: Hx. Heart murmur.  Patient denies shortness of breath, fever, cough and chest pain at PAT appointment   Patient verbalized understanding of instructions that were given to them at the PAT appointment. Patient was also instructed that they will need to review over the PAT instructions again at home before surgery.

## 2019-11-18 ENCOUNTER — Other Ambulatory Visit (HOSPITAL_COMMUNITY)
Admission: RE | Admit: 2019-11-18 | Discharge: 2019-11-18 | Disposition: A | Discharge status: Home / Self Care | Payer: BC Managed Care – PPO | Source: Ambulatory Visit | Attending: General Surgery | Admitting: General Surgery

## 2019-11-18 DIAGNOSIS — Z20822 Contact with and (suspected) exposure to covid-19: Secondary | ICD-10-CM | POA: Diagnosis not present

## 2019-11-18 DIAGNOSIS — Z01812 Encounter for preprocedural laboratory examination: Secondary | ICD-10-CM | POA: Diagnosis not present

## 2019-11-18 LAB — SARS CORONAVIRUS 2 (TAT 6-24 HRS): SARS Coronavirus 2: NEGATIVE

## 2019-11-21 NOTE — Anesthesia Preprocedure Evaluation (Addendum)
Anesthesia Evaluation  Patient identified by MRN, date of birth, ID band Patient awake    Reviewed: Allergy & Precautions, NPO status , Patient's Chart, lab work & pertinent test results  History of Anesthesia Complications Negative for: history of anesthetic complications  Airway Mallampati: II  TM Distance: >3 FB Neck ROM: Full    Dental no notable dental hx. (+) Dental Advisory Given   Pulmonary neg pulmonary ROS,    Pulmonary exam normal        Cardiovascular negative cardio ROS Normal cardiovascular exam     Neuro/Psych negative neurological ROS     GI/Hepatic Neg liver ROS,   Endo/Other  negative endocrine ROS  Renal/GU negative Renal ROS     Musculoskeletal negative musculoskeletal ROS (+)   Abdominal   Peds  Hematology negative hematology ROS (+)   Anesthesia Other Findings   Reproductive/Obstetrics                            Anesthesia Physical Anesthesia Plan  ASA: I  Anesthesia Plan: General   Post-op Pain Management:    Induction: Intravenous  PONV Risk Score and Plan: 4 or greater and Ondansetron, Dexamethasone, Midazolam and Scopolamine patch - Pre-op  Airway Management Planned: Oral ETT  Additional Equipment:   Intra-op Plan:   Post-operative Plan: Extubation in OR  Informed Consent: I have reviewed the patients History and Physical, chart, labs and discussed the procedure including the risks, benefits and alternatives for the proposed anesthesia with the patient or authorized representative who has indicated his/her understanding and acceptance.     Dental advisory given  Plan Discussed with: Anesthesiologist and CRNA  Anesthesia Plan Comments:       Anesthesia Quick Evaluation

## 2019-11-22 ENCOUNTER — Encounter (HOSPITAL_COMMUNITY)
Admission: RE | Disposition: A | Discharge status: Home / Self Care | Payer: Self-pay | Source: Home / Self Care | Attending: General Surgery

## 2019-11-22 ENCOUNTER — Encounter (HOSPITAL_COMMUNITY): Payer: Self-pay | Admitting: General Surgery

## 2019-11-22 ENCOUNTER — Ambulatory Visit (HOSPITAL_COMMUNITY): Payer: BC Managed Care – PPO | Admitting: Anesthesiology

## 2019-11-22 ENCOUNTER — Ambulatory Visit (HOSPITAL_COMMUNITY)
Admission: RE | Admit: 2019-11-22 | Discharge: 2019-11-22 | Disposition: A | Discharge status: Home / Self Care | Payer: BC Managed Care – PPO | Attending: General Surgery | Admitting: General Surgery

## 2019-11-22 ENCOUNTER — Other Ambulatory Visit: Payer: Self-pay

## 2019-11-22 DIAGNOSIS — Z96653 Presence of artificial knee joint, bilateral: Secondary | ICD-10-CM | POA: Diagnosis not present

## 2019-11-22 DIAGNOSIS — K4090 Unilateral inguinal hernia, without obstruction or gangrene, not specified as recurrent: Secondary | ICD-10-CM | POA: Insufficient documentation

## 2019-11-22 HISTORY — PX: XI ROBOTIC ASSISTED INGUINAL HERNIA REPAIR WITH MESH: SHX6706

## 2019-11-22 SURGERY — REPAIR, HERNIA, INGUINAL, ROBOT-ASSISTED, LAPAROSCOPIC, USING MESH
Anesthesia: General

## 2019-11-22 MED ORDER — FENTANYL CITRATE (PF) 100 MCG/2ML IJ SOLN
25.0000 ug | INTRAMUSCULAR | Status: DC | PRN
  Administered 2019-11-22: 50 ug via INTRAVENOUS

## 2019-11-22 MED ORDER — ACETAMINOPHEN 500 MG PO TABS
1000.0000 mg | ORAL_TABLET | ORAL | Status: AC
  Administered 2019-11-22: 1000 mg via ORAL

## 2019-11-22 MED ORDER — PROPOFOL 10 MG/ML IV BOLUS
INTRAVENOUS | Status: AC
  Filled 2019-11-22: qty 20

## 2019-11-22 MED ORDER — GABAPENTIN 300 MG PO CAPS
300.0000 mg | ORAL_CAPSULE | ORAL | Status: AC
  Administered 2019-11-22: 300 mg via ORAL
  Filled 2019-11-22: qty 1

## 2019-11-22 MED ORDER — EPHEDRINE SULFATE 50 MG/ML IJ SOLN
INTRAMUSCULAR | Status: DC | PRN
Start: 2019-11-22 — End: 2019-11-22
  Administered 2019-11-22: 5 mg via INTRAVENOUS
  Administered 2019-11-22: 10 mg via INTRAVENOUS

## 2019-11-22 MED ORDER — ACETAMINOPHEN 500 MG PO TABS
1000.0000 mg | ORAL_TABLET | Freq: Once | ORAL | Status: AC
  Filled 2019-11-22: qty 2

## 2019-11-22 MED ORDER — CHLORHEXIDINE GLUCONATE CLOTH 2 % EX PADS: 6.0000 | MEDICATED_PAD | Freq: Once | CUTANEOUS | Status: DC

## 2019-11-22 MED ORDER — ONDANSETRON HCL 4 MG/2ML IJ SOLN
INTRAMUSCULAR | Status: AC
  Filled 2019-11-22: qty 2

## 2019-11-22 MED ORDER — ROCURONIUM BROMIDE 100 MG/10ML IV SOLN
INTRAVENOUS | Status: DC | PRN
  Administered 2019-11-22: 60 mg via INTRAVENOUS
  Administered 2019-11-22 (×2): 20 mg via INTRAVENOUS

## 2019-11-22 MED ORDER — CEFAZOLIN SODIUM-DEXTROSE 2-4 GM/100ML-% IV SOLN
2.0000 g | INTRAVENOUS | Status: AC
  Administered 2019-11-22: 2 g via INTRAVENOUS
  Filled 2019-11-22: qty 100

## 2019-11-22 MED ORDER — FENTANYL CITRATE (PF) 250 MCG/5ML IJ SOLN
INTRAMUSCULAR | Status: DC | PRN
  Administered 2019-11-22 (×2): 100 ug via INTRAVENOUS

## 2019-11-22 MED ORDER — BUPIVACAINE HCL 0.25 % IJ SOLN
INTRAMUSCULAR | Status: DC | PRN
  Administered 2019-11-22: 50 mL

## 2019-11-22 MED ORDER — ORAL CARE MOUTH RINSE: 15.0000 mL | Freq: Once | OROMUCOSAL | Status: AC

## 2019-11-22 MED ORDER — FENTANYL CITRATE (PF) 250 MCG/5ML IJ SOLN
INTRAMUSCULAR | Status: AC
  Filled 2019-11-22: qty 5

## 2019-11-22 MED ORDER — DEXAMETHASONE SODIUM PHOSPHATE 10 MG/ML IJ SOLN
INTRAMUSCULAR | Status: DC | PRN
  Administered 2019-11-22: 10 mg via INTRAVENOUS

## 2019-11-22 MED ORDER — DEXAMETHASONE SODIUM PHOSPHATE 10 MG/ML IJ SOLN
INTRAMUSCULAR | Status: AC
  Filled 2019-11-22: qty 1

## 2019-11-22 MED ORDER — KETAMINE HCL 10 MG/ML IJ SOLN
INTRAMUSCULAR | Status: DC | PRN
Start: 2019-11-22 — End: 2019-11-22
  Administered 2019-11-22: 40 mg via INTRAVENOUS

## 2019-11-22 MED ORDER — CHLORHEXIDINE GLUCONATE 0.12 % MT SOLN
15.0000 mL | Freq: Once | OROMUCOSAL | Status: AC
  Administered 2019-11-22: 15 mL via OROMUCOSAL

## 2019-11-22 MED ORDER — LACTATED RINGERS IV SOLN: INTRAVENOUS | Status: DC

## 2019-11-22 MED ORDER — OXYCODONE HCL 5 MG PO TABS: 5.0000 mg | ORAL_TABLET | Freq: Four times a day (QID) | ORAL | 0 refills | Status: AC | PRN

## 2019-11-22 MED ORDER — OXYCODONE HCL 5 MG PO TABS: 5.0000 mg | ORAL_TABLET | Freq: Once | ORAL | Status: AC | PRN

## 2019-11-22 MED ORDER — CELECOXIB 200 MG PO CAPS
200.0000 mg | ORAL_CAPSULE | Freq: Once | ORAL | Status: AC
  Administered 2019-11-22: 200 mg via ORAL
  Filled 2019-11-22: qty 1

## 2019-11-22 MED ORDER — MIDAZOLAM HCL 2 MG/2ML IJ SOLN
INTRAMUSCULAR | Status: AC
  Filled 2019-11-22: qty 2

## 2019-11-22 MED ORDER — MIDAZOLAM HCL 5 MG/5ML IJ SOLN
INTRAMUSCULAR | Status: DC | PRN
  Administered 2019-11-22: 2 mg via INTRAVENOUS

## 2019-11-22 MED ORDER — LIDOCAINE 20MG/ML (2%) 15 ML SYRINGE OPTIME
INTRAMUSCULAR | Status: DC | PRN
  Administered 2019-11-22: 1.5 mg/kg/h via INTRAVENOUS

## 2019-11-22 MED ORDER — 0.9 % SODIUM CHLORIDE (POUR BTL) OPTIME
TOPICAL | Status: DC | PRN
Start: 2019-11-22 — End: 2019-11-22
  Administered 2019-11-22: 1000 mL

## 2019-11-22 MED ORDER — ROCURONIUM BROMIDE 10 MG/ML (PF) SYRINGE
PREFILLED_SYRINGE | INTRAVENOUS | Status: AC
  Filled 2019-11-22: qty 10

## 2019-11-22 MED ORDER — SUGAMMADEX SODIUM 200 MG/2ML IV SOLN
INTRAVENOUS | Status: DC | PRN
  Administered 2019-11-22: 200 mg via INTRAVENOUS

## 2019-11-22 MED ORDER — ONDANSETRON HCL 4 MG/2ML IJ SOLN
INTRAMUSCULAR | Status: DC | PRN
  Administered 2019-11-22: 4 mg via INTRAVENOUS

## 2019-11-22 MED ORDER — ACETAMINOPHEN 500 MG PO TABS
1000.0000 mg | ORAL_TABLET | Freq: Three times a day (TID) | ORAL | 0 refills | Status: AC
Start: 2019-11-22 — End: 2019-11-27

## 2019-11-22 MED ORDER — SCOPOLAMINE 1 MG/3DAYS TD PT72
1.0000 | MEDICATED_PATCH | TRANSDERMAL | Status: DC
  Administered 2019-11-22: 1.5 mg via TRANSDERMAL
  Filled 2019-11-22: qty 1

## 2019-11-22 MED ORDER — KETOROLAC TROMETHAMINE 30 MG/ML IJ SOLN
INTRAMUSCULAR | Status: AC
  Filled 2019-11-22: qty 1

## 2019-11-22 MED ORDER — LIDOCAINE HCL 2 % IJ SOLN
INTRAMUSCULAR | Status: AC
  Filled 2019-11-22: qty 20

## 2019-11-22 MED ORDER — LIDOCAINE 2% (20 MG/ML) 5 ML SYRINGE
INTRAMUSCULAR | Status: AC
  Filled 2019-11-22: qty 5

## 2019-11-22 MED ORDER — OXYCODONE HCL 5 MG PO TABS
ORAL_TABLET | ORAL | Status: AC
  Administered 2019-11-22: 5 mg via ORAL
  Filled 2019-11-22: qty 1

## 2019-11-22 MED ORDER — PROMETHAZINE HCL 25 MG/ML IJ SOLN: 6.2500 mg | INTRAMUSCULAR | Status: DC | PRN

## 2019-11-22 MED ORDER — LIDOCAINE HCL (CARDIAC) PF 100 MG/5ML IV SOSY
PREFILLED_SYRINGE | INTRAVENOUS | Status: DC | PRN
  Administered 2019-11-22: 100 mg via INTRAVENOUS

## 2019-11-22 MED ORDER — BUPIVACAINE HCL 0.25 % IJ SOLN
INTRAMUSCULAR | Status: AC
  Filled 2019-11-22: qty 1

## 2019-11-22 MED ORDER — PROPOFOL 10 MG/ML IV BOLUS
INTRAVENOUS | Status: DC | PRN
  Administered 2019-11-22: 200 mg via INTRAVENOUS

## 2019-11-22 MED ORDER — FENTANYL CITRATE (PF) 100 MCG/2ML IJ SOLN
INTRAMUSCULAR | Status: AC
  Administered 2019-11-22: 50 ug via INTRAVENOUS
  Filled 2019-11-22: qty 2

## 2019-11-22 MED ORDER — KETOROLAC TROMETHAMINE 30 MG/ML IJ SOLN
INTRAMUSCULAR | Status: DC | PRN
Start: 2019-11-22 — End: 2019-11-22
  Administered 2019-11-22: 30 mg via INTRAVENOUS

## 2019-11-22 SURGICAL SUPPLY — 55 items
APL PRP STRL LF DISP 70% ISPRP (MISCELLANEOUS) ×1
APL SWBSTK 6 STRL LF DISP (MISCELLANEOUS) ×2
APPLICATOR COTTON TIP 6 STRL (MISCELLANEOUS) ×2 IMPLANT
APPLICATOR COTTON TIP 6IN STRL (MISCELLANEOUS) ×4
BLADE SURG SZ11 CARB STEEL (BLADE) ×2 IMPLANT
BNDG ADH 1X3 SHEER STRL LF (GAUZE/BANDAGES/DRESSINGS) ×3 IMPLANT
BNDG ADH THN 3X1 STRL LF (GAUZE/BANDAGES/DRESSINGS) ×3
CHLORAPREP W/TINT 26 (MISCELLANEOUS) ×2 IMPLANT
COVER SURGICAL LIGHT HANDLE (MISCELLANEOUS) ×2 IMPLANT
COVER TIP SHEARS 8 DVNC (MISCELLANEOUS) ×1 IMPLANT
COVER TIP SHEARS 8MM DA VINCI (MISCELLANEOUS) ×2
COVER WAND RF STERILE (DRAPES) IMPLANT
DECANTER SPIKE VIAL GLASS SM (MISCELLANEOUS) ×2 IMPLANT
DRAPE ARM DVNC X/XI (DISPOSABLE) ×4 IMPLANT
DRAPE COLUMN DVNC XI (DISPOSABLE) ×1 IMPLANT
DRAPE DA VINCI XI ARM (DISPOSABLE) ×8
DRAPE DA VINCI XI COLUMN (DISPOSABLE) ×2
ELECT REM PT RETURN 15FT ADLT (MISCELLANEOUS) ×2 IMPLANT
GLOVE BIO SURGEON STRL SZ 6 (GLOVE) ×4 IMPLANT
GLOVE INDICATOR 6.5 STRL GRN (GLOVE) ×4 IMPLANT
GOWN STRL REUS W/TWL LRG LVL3 (GOWN DISPOSABLE) ×4 IMPLANT
GOWN STRL REUS W/TWL XL LVL3 (GOWN DISPOSABLE) ×4 IMPLANT
GRASPER SUT TROCAR 14GX15 (MISCELLANEOUS) ×1 IMPLANT
IRRIG SUCT STRYKERFLOW 2 WTIP (MISCELLANEOUS)
IRRIGATION SUCT STRKRFLW 2 WTP (MISCELLANEOUS) ×1 IMPLANT
KIT BASIN (CUSTOM PROCEDURE TRAY) ×2 IMPLANT
KIT TURNOVER KIT A (KITS) IMPLANT
MESH 3DMAX LIGHT 4.1X6.2 RT LR (Mesh General) ×1 IMPLANT
NDL INSUFFLATION 14GA 120MM (NEEDLE) IMPLANT
NEEDLE HYPO 22GX1.5 SAFETY (NEEDLE) ×2 IMPLANT
NEEDLE INSUFFLATION 14GA 120MM (NEEDLE) ×2 IMPLANT
PACK CARDIOVASCULAR III (CUSTOM PROCEDURE TRAY) ×2 IMPLANT
PAD POSITIONING PINK XL (MISCELLANEOUS) ×2 IMPLANT
PENCIL SMOKE EVACUATOR (MISCELLANEOUS) IMPLANT
SCISSORS LAP 5X35 DISP (ENDOMECHANICALS) ×1 IMPLANT
SEAL CANN UNIV 5-8 DVNC XI (MISCELLANEOUS) ×3 IMPLANT
SEAL XI 5MM-8MM UNIVERSAL (MISCELLANEOUS) ×6
SET TUBE SMOKE EVAC HIGH FLOW (TUBING) ×1 IMPLANT
SOL ANTI FOG 6CC (MISCELLANEOUS) ×1 IMPLANT
SOLUTION ANTI FOG 6CC (MISCELLANEOUS) ×1
SOLUTION ELECTROLUBE (MISCELLANEOUS) ×2 IMPLANT
SPONGE LAP 18X18 RF (DISPOSABLE) ×2 IMPLANT
STRIP CLOSURE SKIN 1/2X4 (GAUZE/BANDAGES/DRESSINGS) ×1 IMPLANT
SUT MNCRL AB 4-0 PS2 18 (SUTURE) ×2 IMPLANT
SUT VIC AB 0 UR5 27 (SUTURE) ×1 IMPLANT
SUT VIC AB 3-0 SH 27 (SUTURE) ×6
SUT VIC AB 3-0 SH 27XBRD (SUTURE) ×2 IMPLANT
SUT VIC AB 4-0 SH 18 (SUTURE) ×2 IMPLANT
SUT VLOC 180 2-0 6IN GS21 (SUTURE) ×1 IMPLANT
SUT VLOC BARB 180 ABS3/0GR12 (SUTURE) ×4
SUTURE VLOC BRB 180 ABS3/0GR12 (SUTURE) IMPLANT
SYR 20ML LL LF (SYRINGE) ×2 IMPLANT
TOWEL OR 17X26 10 PK STRL BLUE (TOWEL DISPOSABLE) ×2 IMPLANT
TOWEL OR NON WOVEN STRL DISP B (DISPOSABLE) ×2 IMPLANT
TROCAR BLADELESS OPT 5 100 (ENDOMECHANICALS) ×1 IMPLANT

## 2019-11-22 NOTE — Anesthesia Postprocedure Evaluation (Signed)
Anesthesia Post Note  Patient: Greg Boone  Procedure(s) Performed: XI ROBOTIC ASSISTED INGUINAL HERNIA REPAIR WITH MESH (N/A )     Patient location during evaluation: PACU Anesthesia Type: General Level of consciousness: sedated Pain management: pain level controlled Vital Signs Assessment: post-procedure vital signs reviewed and stable Respiratory status: spontaneous breathing and respiratory function stable Cardiovascular status: stable Postop Assessment: no apparent nausea or vomiting Anesthetic complications: no   No complications documented.  Last Vitals:  Vitals:   11/22/19 1300 11/22/19 1315  BP: (!) 146/73 137/70  Pulse: 73 69  Resp: 12 19  Temp:  36.6 C  SpO2: 98% 100%    Last Pain:  Vitals:   11/22/19 1315  TempSrc:   PainSc: 4                  Jayshon Dommer DANIEL

## 2019-11-22 NOTE — H&P (Signed)
Greg Boone is an 49 y.o. male.   Chief Complaint: here for surgery HPI: 49 yo here for repair of right inguinal hernia. Denies any changes since seen in clinic. No cp/sob/n/v/f/c.   The patient is a 49 year old male who presents with an inguinal hernia. He is referred by Dr. Selena Batten for evaluation of a right inguinal hernia. The patient states that he first noticed it around 1994 after playing basketball. He didn't really bother him with respect to causing any pain or discomfort. He was always able to reduce it himself. He wore a hernia belt for many years. However more recently he is having to reduce it more frequently. Toward the end of the work day it is causing more "irritation" and he is having to reduce it more frequently. He also states that after eating it'll generally pop out and he will feel it. He denies any burning, shooting or stabbing pain. He'll generally have less discomfort after sitting down. He denies any significant BPH symptoms. He denies any tobacco use. He denies any chest pain, chest pressure, source of breath, dyspnea on exertion, TIAs or amaurosis fugax. He denies any prior abdominal surgery.  I reviewed his PCP note from the end of March. He had a mildly borderline hemoglobin of 12 but a normal hematocrit. MCV was 74. T bili was 1.4 but otherwise his liver function test and comprehensive metabolic panel was normal.  Past Medical History:  Diagnosis Date  . Heart murmur   . Pneumonia     Past Surgical History:  Procedure Laterality Date  . KNEE ARTHROPLASTY Left   . KNEE ARTHROSCOPY Right     History reviewed. No pertinent family history. Social History:  reports that he has never smoked. He has never used smokeless tobacco. He reports current alcohol use of about 4.0 standard drinks of alcohol per week. He reports that he does not use drugs.  Allergies: No Known Allergies  No medications prior to admission.    No results found for this or any  previous visit (from the past 48 hour(s)). No results found.  Review of Systems  All other systems reviewed and are negative.   Blood pressure (!) 141/80, pulse (!) 58, temperature 98.9 F (37.2 C), temperature source Oral, resp. rate 18, height 6' (1.829 m), weight 77.6 kg, SpO2 99 %. Physical Exam  Vitals reviewed. Constitutional: He is oriented to person, place, and time. He appears well-developed. No distress.  HENT:  Head: Normocephalic and atraumatic.  Right Ear: External ear normal.  Left Ear: External ear normal.  Eyes: Conjunctivae are normal. No scleral icterus.  Neck: No tracheal deviation present. No thyromegaly present.  Cardiovascular: Normal rate and normal heart sounds.  Respiratory: Effort normal and breath sounds normal. No stridor. No respiratory distress. He has no wheezes.  Musculoskeletal:        General: No tenderness.     Cervical back: Normal range of motion and neck supple.  Lymphadenopathy:    He has no cervical adenopathy.  Neurological: He is alert and oriented to person, place, and time. He exhibits normal muscle tone.  Skin: Skin is warm and dry. No rash noted. He is not diaphoretic. No erythema. No pallor.  Psychiatric: His behavior is normal. Judgment and thought content normal.     Assessment/Plan Right inguinal hernia Elevated BP  To OR for xi robotic right inguinal hernia repair with mesh ERAS meds scds Empty bladder prior to surgery IV abx on call  All questions  asked and answered.  Informed pt dr Randall Hiss wallace will be observing case.   Leighton Ruff. Redmond Pulling, MD, FACS General, Bariatric, & Minimally Invasive Surgery [facility], [place]   Greer Pickerel, MD 11/22/2019, 9:05 AM

## 2019-11-22 NOTE — Op Note (Signed)
11/22/2019  Greg Boone 11/09/1970   PREOPERATIVE DIAGNOSIS: right inguinal hernia.   POSTOPERATIVE DIAGNOSIS: right indirect inguinal hernia.   PROCEDURE: Robotic repair of right indirect inguinal hernia with  mesh (TAPP).   SURGEON: Leighton Ruff. Redmond Pulling, MD   ASSISTANT SURGEON: Annye English MD.   ANESTHESIA: General plus local consisting of 0.25% Marcaine with epi.   ESTIMATED BLOOD LOSS: Minimal.   FINDINGS: The patient had a right indirect.  It was repaired using a Bard 3d light large  SPECIMEN: none  INDICATIONS FOR PROCEDURE: 49 year old male presents for elective repair of a symptomatic right inguinal hernia. The risks and benefits including but not limited to bleeding, infection, chronic inguinal pain, nerve entrapment, hernia recurrence, mesh complications, hematoma formation, urinary retention, injury to the testicles or the ovaries, numbness in the groin, blood clots, injury to the surrounding structures, and anesthesia risk was discussed with the patient.  He was given oral Tylenol and gabapentin preoperatively  DESCRIPTION OF PROCEDURE: After obtaining verbal consent and marking  the right groin in the holding area with the patient confirming the  operative site, the patient was then taken back to the operating room, placed  supine on the operating room table. General endotracheal anesthesia was  established. The patient had emptied their bladder prior to going back to  the operating room. Sequential compression devices were placed. The  abdomen and groin were prepped and draped in the usual standard surgical  fashion with ChloraPrep. The patient received IV  antibiotic prior to the incision. A surgical time-out was performed.  Local was infiltrated at the base of the umbilicus.   Next, a 1-cm vertical supraumbilical incision was made with a #11 blade.  The fascia was grasped with a towel clamp and lifted anteriorly.  Next a varies needle was introduced through the  fashion carefully entered the abdominal cavity.  Saline drop test was performed.  Pneumoperitoneum was established.  Initial intra-abdominal pressure was 4 mmHg.  There was smooth symmetric insufflation.  The Veress needle was removed and a 8 mm robotic trocar was introduced.  The endoscope was advanced and the abdominal cavity was surveilled.  There is no evidence of injury to surrounding structures.  No adhesions to the anterior abdominal wall.  A bilateral laparoscopic tap block was then performed with quarter percent Marcaine.  We then placed an 8 mm robotic trocar slightly above and to the right of the supraumbilical trocar about 8 cm from the abdomen.  A similar Lee placed 8 mm trocar was placed on the patient's left again about 8 cm from the supraumbilical trocar.  The [employer] XI robot was then deployed for pelvic surgery.  The robotic arms were attached to the trochars.  The endoscope was advanced and the right groin was targeted for surgery.  Monopolar curved scissors were placed in the right robotic arm and fenestrated bipolar grasper in the left arm.  I then scrubbed out and went to the surgeon console while my assistant surgeon stayed at the bedside.  There was no evidence of a  contralateral hernia. The patient had a defect lateral to  the inferior epigastric vessel, consistent with an right indirect  hernia.  I then  made incision along the peritoneum on the right, starting 2 inches above  the anterior superior iliac spine and caring it medial  toward the median umbilical ligament in a lazy S configuration using  Monopolar curved scissors with electrocautery. The peritoneal flap was then gently  dissected downward from  the anterior abdominal wall taking care not to  injure the inferior epigastric vessels. The pubic bone was identified.  The testicular vessels were identified.  Using  traction and counter traction, I reduced the sac in  its entirety. The testicular vessels had been  identified and preserved. The vas deferens was identified and preserved, and the hernia sac was stripped from those to  surrounding structures. I then went about creating a large pocket by  lifting the peritoneum of the pelvic floor. I took great care not to  injure the iliac vessels.  There was a defect made in the peritoneal flap while creating the pocket in the pelvis.  My assistant placed a piece of right large 3D light mesh through one of the robotic trochars as well as placing a 3-0 Vicryl on SH needle along with a 3 OV lock suture.  A mega cut suture needle driver was placed in one of the robotic arms.  Using the needle driver and the fenestrated bipolar instrument I placed the mesh in the right groin.  Half of the mesh was curving the direct space and the other half was covering the indirect space.  The mesh extended below Cooper's ligament by about 2 cm.  It also reached to the symphysis pubis.  At this point I thought the mesh was in good position.  It was secured to the Cooper's ligament with an interrupted 3-0 Vicryl suture.  I then placed another 3-0 Vicryl suture superior medially to the inferior gastric vessel and then 1 final suture along the superior lateral edge of the mesh just lateral to the inferior gastric vessel.  The mesh was well seated.  I then reapproximated the peritoneal flap to the anterior abdominal wall with a running V-Loc 3-0 suture.  These 2 needles were then removed by my assistant.  He then placed an additional 3 OV lock into the abdomen so I could close the rent in the peritoneal flap which was at the base.  This was closed in a running fashion.  In the flap defect was well approximated.  The remaining suture was removed.  The mesh was flat against the groin.  It had not curved up when the flap was sutured to the abdominal wall.  The robot was then undocked.  I then scrubbed back in.  We closed the supraumbilical fascial defect with 2 interrupted 0 Vicryls using the PMI  suture passer.  Pneumoperitoneum was released.  Remaining trochars were removed.  Some additional local was infiltrated at the umbilical fascial closure.  All skin incisions  were closed with a 4-0 Monocryl in a subcuticular fashion followed by  application of Steri-Strips and Band-Aids all needle, instrument, and sponge counts  were correct x2. There are no immediate complications. The patient  tolerated the procedure well. The patient was extubated and taken to the  recovery room in stable condition.  Dr. Odelia Gage observe the case  Mary Sella. Andrey Campanile, MD, FACS General, Bariatric, & Minimally Invasive Surgery [facility], [place]

## 2019-11-22 NOTE — Anesthesia Procedure Notes (Signed)
Procedure Name: Intubation Date/Time: 11/22/2019 9:37 AM Performed by: Thornell Mule, CRNA Pre-anesthesia Checklist: Patient identified, Emergency Drugs available, Suction available and Patient being monitored Patient Re-evaluated:Patient Re-evaluated prior to induction Oxygen Delivery Method: Circle system utilized Preoxygenation: Pre-oxygenation with 100% oxygen Induction Type: IV induction Ventilation: Mask ventilation without difficulty Laryngoscope Size: Miller and 3 Grade View: Grade I Tube type: Oral Tube size: 7.5 mm Number of attempts: 1 Airway Equipment and Method: Stylet and Oral airway Placement Confirmation: ETT inserted through vocal cords under direct vision,  positive ETCO2 and breath sounds checked- equal and bilateral Secured at: 22 cm Tube secured with: Tape Dental Injury: Teeth and Oropharynx as per pre-operative assessment

## 2019-11-22 NOTE — Transfer of Care (Signed)
Immediate Anesthesia Transfer of Care Note  Patient: Greg Boone  Procedure(s) Performed: XI ROBOTIC ASSISTED INGUINAL HERNIA REPAIR WITH MESH (N/A )  Patient Location: PACU  Anesthesia Type:General  Level of Consciousness: drowsy, patient cooperative and responds to stimulation  Airway & Oxygen Therapy: Patient Spontanous Breathing and Patient connected to face mask oxygen  Post-op Assessment: Report given to RN and Post -op Vital signs reviewed and stable  Post vital signs: Reviewed and stable  Last Vitals:  Vitals Value Taken Time  BP 150/80 11/22/19 1145  Temp    Pulse 73 11/22/19 1145  Resp 13 11/22/19 1145  SpO2 100 % 11/22/19 1145  Vitals shown include unvalidated device data.  Last Pain:  Vitals:   11/22/19 0752  TempSrc:   PainSc: 0-No pain      Patients Stated Pain Goal: 4 (11/22/19 0752)  Complications: No complications documented.

## 2019-11-23 ENCOUNTER — Encounter (HOSPITAL_COMMUNITY): Payer: Self-pay | Admitting: General Surgery

## 2020-06-21 DIAGNOSIS — Z1152 Encounter for screening for COVID-19: Secondary | ICD-10-CM | POA: Diagnosis not present

## 2020-09-08 ENCOUNTER — Other Ambulatory Visit: Payer: Self-pay

## 2020-09-08 ENCOUNTER — Emergency Department (HOSPITAL_BASED_OUTPATIENT_CLINIC_OR_DEPARTMENT_OTHER)
Admission: EM | Admit: 2020-09-08 | Discharge: 2020-09-08 | Disposition: A | Discharge status: Home / Self Care | Payer: BC Managed Care – PPO | Attending: Emergency Medicine | Admitting: Emergency Medicine

## 2020-09-08 ENCOUNTER — Encounter (HOSPITAL_BASED_OUTPATIENT_CLINIC_OR_DEPARTMENT_OTHER): Payer: Self-pay | Admitting: *Deleted

## 2020-09-08 DIAGNOSIS — R220 Localized swelling, mass and lump, head: Secondary | ICD-10-CM | POA: Diagnosis not present

## 2020-09-08 DIAGNOSIS — L509 Urticaria, unspecified: Secondary | ICD-10-CM | POA: Diagnosis not present

## 2020-09-08 DIAGNOSIS — T781XXA Other adverse food reactions, not elsewhere classified, initial encounter: Secondary | ICD-10-CM | POA: Insufficient documentation

## 2020-09-08 DIAGNOSIS — X58XXXA Exposure to other specified factors, initial encounter: Secondary | ICD-10-CM | POA: Insufficient documentation

## 2020-09-08 DIAGNOSIS — T7840XA Allergy, unspecified, initial encounter: Secondary | ICD-10-CM | POA: Diagnosis not present

## 2020-09-08 DIAGNOSIS — R202 Paresthesia of skin: Secondary | ICD-10-CM | POA: Diagnosis not present

## 2020-09-08 MED ORDER — FAMOTIDINE IN NACL 20-0.9 MG/50ML-% IV SOLN
20.0000 mg | Freq: Once | INTRAVENOUS | Status: AC
  Administered 2020-09-08: 20 mg via INTRAVENOUS

## 2020-09-08 MED ORDER — FAMOTIDINE 20 MG PO TABS: 40.0000 mg | ORAL_TABLET | Freq: Once | ORAL | Status: DC

## 2020-09-08 MED ORDER — PREDNISONE 50 MG PO TABS: 60.0000 mg | ORAL_TABLET | Freq: Once | ORAL | Status: DC

## 2020-09-08 MED ORDER — DIPHENHYDRAMINE HCL 25 MG PO CAPS: 50.0000 mg | ORAL_CAPSULE | Freq: Once | ORAL | Status: DC

## 2020-09-08 MED ORDER — METHYLPREDNISOLONE SODIUM SUCC 125 MG IJ SOLR
125.0000 mg | Freq: Once | INTRAMUSCULAR | Status: AC
  Administered 2020-09-08: 125 mg via INTRAVENOUS

## 2020-09-08 MED ORDER — PREDNISONE 50 MG PO TABS
50.0000 mg | ORAL_TABLET | Freq: Every day | ORAL | 0 refills | Status: AC
Start: 2020-09-08 — End: ?

## 2020-09-08 MED ORDER — CETIRIZINE HCL 10 MG PO TABS: 10.0000 mg | ORAL_TABLET | Freq: Every day | ORAL | 0 refills | Status: AC

## 2020-09-08 MED ORDER — FAMOTIDINE IN NACL 20-0.9 MG/50ML-% IV SOLN
20.0000 mg | Freq: Once | INTRAVENOUS | Status: DC
  Filled 2020-09-08: qty 50

## 2020-09-08 MED ORDER — SODIUM CHLORIDE 0.9 % IV SOLN
INTRAVENOUS | Status: DC | PRN
  Administered 2020-09-08: 500 mL via INTRAVENOUS

## 2020-09-08 MED ORDER — DIPHENHYDRAMINE HCL 50 MG/ML IJ SOLN: 50.0000 mg | Freq: Once | INTRAMUSCULAR | Status: DC

## 2020-09-08 MED ORDER — DIPHENHYDRAMINE HCL 25 MG PO CAPS
50.0000 mg | ORAL_CAPSULE | Freq: Once | ORAL | Status: DC
  Administered 2020-09-08: 50 mg via ORAL
  Filled 2020-09-08: qty 2

## 2020-09-08 MED ORDER — EPINEPHRINE 0.3 MG/0.3ML IJ SOAJ: 0.3000 mg | INTRAMUSCULAR | 0 refills | Status: AC | PRN

## 2020-09-08 MED ORDER — FAMOTIDINE 20 MG PO TABS: 20.0000 mg | ORAL_TABLET | Freq: Two times a day (BID) | ORAL | 0 refills | Status: AC

## 2020-09-08 MED ORDER — METHYLPREDNISOLONE SODIUM SUCC 125 MG IJ SOLR
125.0000 mg | Freq: Once | INTRAMUSCULAR | Status: DC
  Filled 2020-09-08: qty 2

## 2020-09-08 NOTE — Progress Notes (Signed)
Add to previous entry- PT does have whelps on chest, abdomin and back.

## 2020-09-08 NOTE — ED Notes (Signed)
Speech WNL, swallowing is WNL.

## 2020-09-08 NOTE — ED Triage Notes (Signed)
States he has itching, whelps are noted on abd and back, lips feels full per pt statement, speech wnl, swallowing wnl. Breath sounds - Clear, RT at bedside. Resp WNL

## 2020-09-08 NOTE — Discharge Instructions (Signed)
You had hives today, suggestive of allergic reaction.  However, hives can also be idiopathic (no known cause), caused by a viral illness, or other potential causes.  The trigger for your reaction today is unclear.  I would like for you to follow-up with your primary care provider for some next week regarding today's ED encounter and for ongoing evaluation and management.  You may benefit from referral to allergist.  Please use the medications, as prescribed.  Please read the attachment on how to use an EpiPen.  Only use for severe allergic reactions.  I would like you to take the prednisone in the morning with breakfast.    I am glad that your symptoms have all improved here in the ED.  Return to the ED or seek immediate medical attention should you experience any new or worsening symptoms.

## 2020-09-08 NOTE — Progress Notes (Signed)
Assessed PT with complaint of allergic reaction (thinks to shrimp but no history- ate around 1230 today). PT does not appear to be in respiratory distress at this time. PT states he does not feel like his tongue or throat are swollen at this time. Current Sp02 100% on RA, BBS clear.

## 2020-09-08 NOTE — ED Provider Notes (Signed)
[facility] EMERGENCY DEPARTMENT Provider Note   CSN: [id number] Arrival date & time: 09/08/20  1452     History Chief Complaint  Patient presents with  . Allergic Reaction    Greg Boone is a 50 y.o. male with no relevant past medical history who presents the ED for allergic reaction.  On my examination, patient reports that he had eaten out at a restaurant during his lunch break and then felt generalized pruritus at approximately 2 PM.  He looked down and was covered in hives.  He states he feels as though his face is swelling in his lips feel numb as though he just received Novocain.  He states that he ate shrimp and broccoli, both of which he has had on countless occasions.  This is the first time he has had a similar reaction.  He is not on any blood pressure medication and only takes Cialis intermittently.    He denies any nausea or emesis, throat closing sensation, sore throat or voice change, wheezing or stridor, cough or shortness of breath, or other symptoms.  HPI     Past Medical History:  Diagnosis Date  . Heart murmur   . Pneumonia     There are no problems to display for this patient.   Past Surgical History:  Procedure Laterality Date  . KNEE ARTHROPLASTY Left   . KNEE ARTHROSCOPY Right   . XI ROBOTIC ASSISTED INGUINAL HERNIA REPAIR WITH MESH N/A 11/22/2019   Procedure: XI ROBOTIC ASSISTED INGUINAL HERNIA REPAIR WITH MESH;  Surgeon: Gaynelle Adu, MD;  Location: [facility] ORS;  Service: General;  Laterality: N/A;       History reviewed. No pertinent family history.  Social History   Tobacco Use  . Smoking status: Never Smoker  . Smokeless tobacco: Never Used  Vaping Use  . Vaping Use: Never used  Substance Use Topics  . Alcohol use: Yes    Alcohol/week: 4.0 standard drinks    Types: 4 Cans of beer per week    Comment: per day  . Drug use: No    Home Medications Prior to Admission medications   Medication Sig Start Date End Date Taking?  Authorizing Provider  cetirizine (ZYRTEC) 10 MG tablet Take 1 tablet (10 mg total) by mouth daily for 14 days. 09/08/20 09/22/20 Yes Lorelee New, PA-C  EPINEPHrine 0.3 mg/0.3 mL IJ SOAJ injection Inject 0.3 mg into the muscle as needed for anaphylaxis. 09/08/20  Yes Lorelee New, PA-C  famotidine (PEPCID) 20 MG tablet Take 1 tablet (20 mg total) by mouth 2 (two) times daily. 09/08/20  Yes Lorelee New, PA-C  predniSONE (DELTASONE) 50 MG tablet Take 1 tablet (50 mg total) by mouth daily with breakfast. 09/08/20  Yes Lorelee New, PA-C  tadalafil (CIALIS) 5 MG tablet Take 5 mg by mouth daily as needed for erectile dysfunction.   Yes [provider]  oxyCODONE (OXY IR/ROXICODONE) 5 MG immediate release tablet Take 1 tablet (5 mg total) by mouth every 6 (six) hours as needed for severe pain. 11/22/19   Gaynelle Adu, MD    Allergies    Patient has no known allergies.  Review of Systems   Review of Systems  All other systems reviewed and are negative.   Physical Exam Updated Vital Signs BP (!) 157/94   Pulse (!) 54   Temp 98.1 F (36.7 C) (Oral)   Resp 16   Ht 6' (1.829 m)   Wt 77.1 kg  SpO2 100%   BMI 23.06 kg/m   Physical Exam Vitals and nursing note reviewed. Exam conducted with a chaperone present.  Constitutional:      General: He is not in acute distress.    Appearance: He is not toxic-appearing.  HENT:     Head: Normocephalic and atraumatic.     Mouth/Throat:     Pharynx: Oropharynx is clear.     Comments: Patent oropharynx.  No trismus.  No tongue or floor of mouth swelling.  No asymmetries.  No drooling.  No swelling of his lips.  No facial swelling noted. Eyes:     General: No scleral icterus.    Conjunctiva/sclera: Conjunctivae normal.  Cardiovascular:     Rate and Rhythm: Normal rate.     Pulses: Normal pulses.  Pulmonary:     Effort: Pulmonary effort is normal. No respiratory distress.     Breath sounds: No stridor. No wheezing, rhonchi or  rales.     Comments: CTA bilaterally.  No increased work of breathing. Musculoskeletal:     Cervical back: Normal range of motion.  Skin:    General: Skin is dry.     Comments: Hives diffusely over torso, most notably on the upper back and chest.  No hives, swelling, or other over overlying skin changes involving the neck.  Neurological:     Mental Status: He is alert.     GCS: GCS eye subscore is 4. GCS verbal subscore is 5. GCS motor subscore is 6.  Psychiatric:        Mood and Affect: Mood normal.        Behavior: Behavior normal.        Thought Content: Thought content normal.     ED Results / Procedures / Treatments   Labs (all labs ordered are listed, but only abnormal results are displayed) Labs Reviewed - No data to display  EKG None  Radiology No results found.  Procedures Procedures   Medications Ordered in ED Medications  diphenhydrAMINE (BENADRYL) injection 50 mg (50 mg Intravenous Not Given 09/08/20 1554)  0.9 %  sodium chloride infusion ( Intravenous Stopped 09/08/20 1631)  methylPREDNISolone sodium succinate (SOLU-MEDROL) 125 mg/2 mL injection 125 mg (125 mg Intravenous Given 09/08/20 1540)  famotidine (PEPCID) IVPB 20 mg premix (0 mg Intravenous Stopped 09/08/20 1631)    ED Course  I have reviewed the triage vital signs and the nursing notes.  Pertinent labs & imaging results that were available during my care of the patient were reviewed by me and considered in my medical decision making (see chart for details).    MDM Rules/Calculators/A&P                          Greg Boone was evaluated in Emergency Department on 09/08/2020 for the symptoms described in the history of present illness. He was evaluated in the context of the global COVID-19 pandemic, which necessitated consideration that the patient might be at risk for infection with the SARS-CoV-2 virus that causes COVID-19. Institutional protocols and algorithms that pertain to the evaluation of patients  at risk for COVID-19 are in a state of rapid change based on information released by regulatory bodies including the CDC and federal and state organizations. These policies and algorithms were followed during the patient's care in the ED.  I personally reviewed patient's medical chart and all notes from triage and staff during today's encounter. I have also ordered and reviewed all labs  and imaging that I felt to be medically necessary in the evaluation of this patient's complaints and with consideration of their physical exam. If needed, translation services were available and utilized.   Patient with an allergic reaction.  He is covered in hives.  Fortunately there is no respiratory compromise or GI symptoms.  He has only had 1 system involved (dermatologic), does not require treatment with epinephrine.  We will treat with IV Solu-Medrol, Benadryl, and famotidine.  Given his report of facial swelling in context of acute allergic reaction, wanted to have IV access.  Do not feel as though labs would get any significant findings.  While patient does note that these symptoms began shortly after eating lunch, there is no obvious trigger for what precipitated his allergic reaction.  He has eaten shrimp and broccoli in the past without issue.  However, he denies any other symptoms concerning for possible viral infection would have precipitated hives.  Perhaps idiopathic.  Uncertain.  On subsequent evaluation, patient is feeling entirely improved.  He denies any tingling or sensation of facial swelling.  His itching has completely resolved, as has his hives.  He feels.  For discharge and will follow up with his primary care provider first and next week regarding today's ED encounter for ongoing evaluation and management.  We will discharge him home with prednisone burst, continue cetirizine, famotidine, and EpiPen.  ED return precautions discussed.  Patient voices understanding and is agreeable.  Final  Clinical Impression(s) / ED Diagnoses Final diagnoses:  Allergic reaction, initial encounter  Hives    Rx / DC Orders ED Discharge Orders         Ordered    EPINEPHrine 0.3 mg/0.3 mL IJ SOAJ injection  As needed        09/08/20 1729    cetirizine (ZYRTEC) 10 MG tablet  Daily        09/08/20 1729    famotidine (PEPCID) 20 MG tablet  2 times daily        09/08/20 1729    predniSONE (DELTASONE) 50 MG tablet  Daily with breakfast        09/08/20 1729           Lorelee New, PA-C 09/08/20 1730    Linwood Dibbles, MD 09/08/20 2247

## 2020-09-12 DIAGNOSIS — I10 Essential (primary) hypertension: Secondary | ICD-10-CM | POA: Diagnosis not present

## 2020-09-12 DIAGNOSIS — R946 Abnormal results of thyroid function studies: Secondary | ICD-10-CM | POA: Diagnosis not present

## 2020-09-12 DIAGNOSIS — Z13220 Encounter for screening for lipoid disorders: Secondary | ICD-10-CM | POA: Diagnosis not present

## 2020-09-12 DIAGNOSIS — E559 Vitamin D deficiency, unspecified: Secondary | ICD-10-CM | POA: Diagnosis not present

## 2020-09-12 DIAGNOSIS — N529 Male erectile dysfunction, unspecified: Secondary | ICD-10-CM | POA: Diagnosis not present

## 2020-09-12 DIAGNOSIS — R7309 Other abnormal glucose: Secondary | ICD-10-CM | POA: Diagnosis not present

## 2020-09-19 DIAGNOSIS — Z23 Encounter for immunization: Secondary | ICD-10-CM | POA: Diagnosis not present

## 2020-09-19 DIAGNOSIS — E782 Mixed hyperlipidemia: Secondary | ICD-10-CM | POA: Diagnosis not present

## 2020-09-19 DIAGNOSIS — Z0000 Encounter for general adult medical examination without abnormal findings: Secondary | ICD-10-CM | POA: Diagnosis not present

## 2020-09-19 DIAGNOSIS — N529 Male erectile dysfunction, unspecified: Secondary | ICD-10-CM | POA: Diagnosis not present

## 2020-11-01 DIAGNOSIS — Z1211 Encounter for screening for malignant neoplasm of colon: Secondary | ICD-10-CM | POA: Diagnosis not present

## 2020-11-20 DIAGNOSIS — Z23 Encounter for immunization: Secondary | ICD-10-CM | POA: Diagnosis not present

## 2020-12-05 DIAGNOSIS — Z1211 Encounter for screening for malignant neoplasm of colon: Secondary | ICD-10-CM | POA: Diagnosis not present

## 2021-09-19 DIAGNOSIS — Z0000 Encounter for general adult medical examination without abnormal findings: Secondary | ICD-10-CM | POA: Diagnosis not present

## 2021-09-19 DIAGNOSIS — R5383 Other fatigue: Secondary | ICD-10-CM | POA: Diagnosis not present

## 2021-09-19 DIAGNOSIS — Z125 Encounter for screening for malignant neoplasm of prostate: Secondary | ICD-10-CM | POA: Diagnosis not present

## 2021-09-19 DIAGNOSIS — D509 Iron deficiency anemia, unspecified: Secondary | ICD-10-CM | POA: Diagnosis not present

## 2021-09-19 DIAGNOSIS — E782 Mixed hyperlipidemia: Secondary | ICD-10-CM | POA: Diagnosis not present

## 2021-10-02 DIAGNOSIS — Z0000 Encounter for general adult medical examination without abnormal findings: Secondary | ICD-10-CM | POA: Diagnosis not present

## 2021-10-02 DIAGNOSIS — E782 Mixed hyperlipidemia: Secondary | ICD-10-CM | POA: Diagnosis not present

## 2021-10-02 DIAGNOSIS — N529 Male erectile dysfunction, unspecified: Secondary | ICD-10-CM | POA: Diagnosis not present

## 2021-10-02 DIAGNOSIS — D509 Iron deficiency anemia, unspecified: Secondary | ICD-10-CM | POA: Diagnosis not present

## 2022-04-04 DIAGNOSIS — M542 Cervicalgia: Secondary | ICD-10-CM | POA: Diagnosis not present

## 2022-04-04 DIAGNOSIS — L821 Other seborrheic keratosis: Secondary | ICD-10-CM | POA: Diagnosis not present

## 2022-04-04 DIAGNOSIS — M791 Myalgia, unspecified site: Secondary | ICD-10-CM | POA: Diagnosis not present

## 2022-05-24 DIAGNOSIS — A64 Unspecified sexually transmitted disease: Secondary | ICD-10-CM | POA: Diagnosis not present

## 2022-05-24 DIAGNOSIS — Z1159 Encounter for screening for other viral diseases: Secondary | ICD-10-CM | POA: Diagnosis not present

## 2022-05-24 DIAGNOSIS — Z1152 Encounter for screening for COVID-19: Secondary | ICD-10-CM | POA: Diagnosis not present

## 2022-09-24 DIAGNOSIS — Z0000 Encounter for general adult medical examination without abnormal findings: Secondary | ICD-10-CM | POA: Diagnosis not present

## 2022-09-24 DIAGNOSIS — R5383 Other fatigue: Secondary | ICD-10-CM | POA: Diagnosis not present

## 2022-09-24 DIAGNOSIS — Z125 Encounter for screening for malignant neoplasm of prostate: Secondary | ICD-10-CM | POA: Diagnosis not present

## 2022-09-24 DIAGNOSIS — D509 Iron deficiency anemia, unspecified: Secondary | ICD-10-CM | POA: Diagnosis not present

## 2022-09-24 DIAGNOSIS — E782 Mixed hyperlipidemia: Secondary | ICD-10-CM | POA: Diagnosis not present

## 2022-10-01 DIAGNOSIS — N529 Male erectile dysfunction, unspecified: Secondary | ICD-10-CM | POA: Diagnosis not present

## 2022-10-01 DIAGNOSIS — Z0000 Encounter for general adult medical examination without abnormal findings: Secondary | ICD-10-CM | POA: Diagnosis not present

## 2022-10-01 DIAGNOSIS — E782 Mixed hyperlipidemia: Secondary | ICD-10-CM | POA: Diagnosis not present

## 2022-10-01 DIAGNOSIS — D509 Iron deficiency anemia, unspecified: Secondary | ICD-10-CM | POA: Diagnosis not present

## 2022-10-27 ENCOUNTER — Other Ambulatory Visit: Payer: Self-pay

## 2022-10-27 ENCOUNTER — Emergency Department (HOSPITAL_BASED_OUTPATIENT_CLINIC_OR_DEPARTMENT_OTHER): Payer: BC Managed Care – PPO

## 2022-10-27 ENCOUNTER — Encounter (HOSPITAL_BASED_OUTPATIENT_CLINIC_OR_DEPARTMENT_OTHER): Payer: Self-pay | Admitting: Emergency Medicine

## 2022-10-27 DIAGNOSIS — M25512 Pain in left shoulder: Secondary | ICD-10-CM | POA: Insufficient documentation

## 2022-10-27 DIAGNOSIS — M542 Cervicalgia: Secondary | ICD-10-CM | POA: Diagnosis not present

## 2022-10-27 DIAGNOSIS — Y92410 Unspecified street and highway as the place of occurrence of the external cause: Secondary | ICD-10-CM | POA: Insufficient documentation
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
## 2022-10-28 ENCOUNTER — Emergency Department (HOSPITAL_BASED_OUTPATIENT_CLINIC_OR_DEPARTMENT_OTHER)
Admission: EM | Admit: 2022-10-28 | Discharge: 2022-10-28 | Disposition: A | Discharge status: Home / Self Care | Payer: BC Managed Care – PPO | Attending: Emergency Medicine | Admitting: Emergency Medicine

## 2022-10-28 MED ORDER — METHOCARBAMOL 500 MG PO TABS: 500.0000 mg | ORAL_TABLET | Freq: Two times a day (BID) | ORAL | 0 refills | Status: AC

## 2022-10-28 MED ORDER — MELOXICAM 15 MG PO TABS: 15.0000 mg | ORAL_TABLET | Freq: Every day | ORAL | 0 refills | Status: DC

## 2022-10-28 MED ORDER — METHOCARBAMOL 500 MG PO TABS: 500.0000 mg | ORAL_TABLET | Freq: Two times a day (BID) | ORAL | 0 refills | Status: DC

## 2022-10-28 MED ORDER — MELOXICAM 15 MG PO TABS: 15.0000 mg | ORAL_TABLET | Freq: Every day | ORAL | 0 refills | Status: AC

## 2022-10-28 MED ORDER — NAPROXEN 250 MG PO TABS
500.0000 mg | ORAL_TABLET | Freq: Once | ORAL | Status: AC
  Administered 2022-10-28: 500 mg via ORAL
  Filled 2022-10-28: qty 2
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
## 2023-02-27 DIAGNOSIS — Z23 Encounter for immunization: Secondary | ICD-10-CM | POA: Diagnosis not present

## 2023-09-25 DIAGNOSIS — E782 Mixed hyperlipidemia: Secondary | ICD-10-CM | POA: Diagnosis not present

## 2023-09-25 DIAGNOSIS — Z125 Encounter for screening for malignant neoplasm of prostate: Secondary | ICD-10-CM | POA: Diagnosis not present

## 2023-09-25 DIAGNOSIS — Z0000 Encounter for general adult medical examination without abnormal findings: Secondary | ICD-10-CM | POA: Diagnosis not present

## 2023-09-25 DIAGNOSIS — E559 Vitamin D deficiency, unspecified: Secondary | ICD-10-CM | POA: Diagnosis not present

## 2023-10-02 ENCOUNTER — Other Ambulatory Visit (HOSPITAL_BASED_OUTPATIENT_CLINIC_OR_DEPARTMENT_OTHER): Payer: Self-pay

## 2023-10-02 DIAGNOSIS — E782 Mixed hyperlipidemia: Secondary | ICD-10-CM | POA: Diagnosis not present

## 2023-10-02 DIAGNOSIS — Z0000 Encounter for general adult medical examination without abnormal findings: Secondary | ICD-10-CM | POA: Diagnosis not present

## 2023-10-02 DIAGNOSIS — D509 Iron deficiency anemia, unspecified: Secondary | ICD-10-CM | POA: Diagnosis not present

## 2023-10-02 DIAGNOSIS — N529 Male erectile dysfunction, unspecified: Secondary | ICD-10-CM | POA: Diagnosis not present

## 2023-10-09 ENCOUNTER — Ambulatory Visit (HOSPITAL_BASED_OUTPATIENT_CLINIC_OR_DEPARTMENT_OTHER)
Admission: RE | Admit: 2023-10-09 | Discharge: 2023-10-09 | Disposition: A | Discharge status: Home / Self Care | Payer: Self-pay | Source: Ambulatory Visit

## 2023-10-09 DIAGNOSIS — E782 Mixed hyperlipidemia: Secondary | ICD-10-CM | POA: Insufficient documentation

## 2024-06-01 DIAGNOSIS — N4889 Other specified disorders of penis: Secondary | ICD-10-CM | POA: Diagnosis not present

## 2024-06-01 DIAGNOSIS — L239 Allergic contact dermatitis, unspecified cause: Secondary | ICD-10-CM | POA: Diagnosis not present
